# Patient Record
Sex: Female | Born: 1970 | Race: White | Hispanic: No | Marital: Single | Smoking: Former smoker
Health system: Southern US, Community
[De-identification: ages and names within clinical notes are randomized; demographics above are authoritative.]

## PROBLEM LIST (undated history)

## (undated) DIAGNOSIS — F319 Bipolar disorder, unspecified: Secondary | ICD-10-CM

## (undated) DIAGNOSIS — M549 Dorsalgia, unspecified: Secondary | ICD-10-CM

## (undated) HISTORY — PX: KNEE SURGERY: SHX244

## (undated) HISTORY — PX: ABDOMINAL HYSTERECTOMY: SHX81

---

## 2007-03-14 ENCOUNTER — Emergency Department (HOSPITAL_COMMUNITY): Admission: EM | Admit: 2007-03-14 | Discharge: 2007-03-14 | Payer: Self-pay | Admitting: Emergency Medicine

## 2010-05-31 ENCOUNTER — Emergency Department (HOSPITAL_COMMUNITY): Admission: EM | Admit: 2010-05-31 | Discharge: 2010-05-31 | Payer: Self-pay | Admitting: Emergency Medicine

## 2010-06-22 ENCOUNTER — Emergency Department (HOSPITAL_COMMUNITY): Admission: EM | Admit: 2010-06-22 | Discharge: 2010-06-22 | Payer: Self-pay | Admitting: Emergency Medicine

## 2010-07-27 ENCOUNTER — Emergency Department (HOSPITAL_COMMUNITY): Admission: EM | Admit: 2010-07-27 | Discharge: 2010-07-27 | Payer: Self-pay | Admitting: Family Medicine

## 2010-08-15 ENCOUNTER — Encounter: Admission: RE | Admit: 2010-08-15 | Discharge: 2010-08-15 | Payer: Self-pay | Admitting: Family Medicine

## 2010-08-23 ENCOUNTER — Encounter: Admission: RE | Admit: 2010-08-23 | Discharge: 2010-09-21 | Payer: Self-pay | Admitting: Sports Medicine

## 2010-08-24 ENCOUNTER — Encounter: Admission: RE | Admit: 2010-08-24 | Discharge: 2010-08-24 | Payer: Self-pay | Admitting: Sports Medicine

## 2010-12-05 ENCOUNTER — Emergency Department (HOSPITAL_COMMUNITY): Admission: EM | Admit: 2010-12-05 | Payer: Self-pay | Source: Home / Self Care | Admitting: Family Medicine

## 2011-02-10 LAB — POCT I-STAT, CHEM 8
BUN: 13 mg/dL (ref 6–23)
Chloride: 100 mEq/L (ref 96–112)
Creatinine, Ser: 1.2 mg/dL (ref 0.4–1.2)
Glucose, Bld: 101 mg/dL — ABNORMAL HIGH (ref 70–99)
Potassium: 4 mEq/L (ref 3.5–5.1)

## 2011-02-10 LAB — DIFFERENTIAL
Eosinophils Relative: 1 % (ref 0–5)
Lymphocytes Relative: 38 % (ref 12–46)
Monocytes Absolute: 0.6 10*3/uL (ref 0.1–1.0)
Monocytes Relative: 6 % (ref 3–12)
Neutro Abs: 5.6 10*3/uL (ref 1.7–7.7)

## 2011-02-10 LAB — URINALYSIS, ROUTINE W REFLEX MICROSCOPIC
Bilirubin Urine: NEGATIVE
Hgb urine dipstick: NEGATIVE
Specific Gravity, Urine: 1.009 (ref 1.005–1.030)
pH: 5.5 (ref 5.0–8.0)

## 2011-02-10 LAB — CBC
HCT: 34 % — ABNORMAL LOW (ref 36.0–46.0)
Hemoglobin: 11.9 g/dL — ABNORMAL LOW (ref 12.0–15.0)
MCHC: 35.1 g/dL (ref 30.0–36.0)
MCV: 91.4 fL (ref 78.0–100.0)

## 2011-02-11 LAB — BASIC METABOLIC PANEL
CO2: 33 mEq/L — ABNORMAL HIGH (ref 19–32)
Chloride: 101 mEq/L (ref 96–112)
GFR calc non Af Amer: 59 mL/min — ABNORMAL LOW (ref >60)
Glucose, Bld: 87 mg/dL (ref 70–99)
Potassium: 3.6 mEq/L (ref 3.5–5.1)
Sodium: 138 mEq/L (ref 135–145)

## 2011-02-11 LAB — CBC
HCT: 37.2 % (ref 36.0–46.0)
Hemoglobin: 13 g/dL (ref 12.0–15.0)
MCH: 31.6 pg (ref 26.0–34.0)
MCHC: 35 g/dL (ref 30.0–36.0)

## 2011-02-11 LAB — DIFFERENTIAL
Basophils Relative: 1 % (ref 0–1)
Monocytes Absolute: 0.8 10*3/uL (ref 0.1–1.0)
Monocytes Relative: 6 % (ref 3–12)
Neutro Abs: 8.6 10*3/uL — ABNORMAL HIGH (ref 1.7–7.7)

## 2011-07-18 ENCOUNTER — Emergency Department (HOSPITAL_COMMUNITY)
Admission: EM | Admit: 2011-07-18 | Discharge: 2011-07-18 | Disposition: A | Discharge status: Home / Self Care | Payer: Medicaid - Out of State | Attending: Emergency Medicine | Admitting: Emergency Medicine

## 2011-07-18 DIAGNOSIS — M1710 Unilateral primary osteoarthritis, unspecified knee: Secondary | ICD-10-CM | POA: Insufficient documentation

## 2011-07-18 DIAGNOSIS — M549 Dorsalgia, unspecified: Secondary | ICD-10-CM | POA: Insufficient documentation

## 2011-07-18 DIAGNOSIS — I10 Essential (primary) hypertension: Secondary | ICD-10-CM | POA: Insufficient documentation

## 2011-07-18 DIAGNOSIS — R609 Edema, unspecified: Secondary | ICD-10-CM | POA: Insufficient documentation

## 2011-10-01 ENCOUNTER — Encounter: Payer: Self-pay | Admitting: *Deleted

## 2011-10-01 ENCOUNTER — Emergency Department (HOSPITAL_COMMUNITY)
Admission: EM | Admit: 2011-10-01 | Discharge: 2011-10-02 | Disposition: A | Discharge status: Home / Self Care | Payer: Medicaid - Out of State | Attending: Emergency Medicine | Admitting: Emergency Medicine

## 2011-10-01 DIAGNOSIS — R0789 Other chest pain: Secondary | ICD-10-CM | POA: Insufficient documentation

## 2011-10-01 DIAGNOSIS — R51 Headache: Secondary | ICD-10-CM | POA: Insufficient documentation

## 2011-10-01 DIAGNOSIS — R0602 Shortness of breath: Secondary | ICD-10-CM | POA: Insufficient documentation

## 2011-10-01 DIAGNOSIS — J45901 Unspecified asthma with (acute) exacerbation: Secondary | ICD-10-CM | POA: Insufficient documentation

## 2011-10-01 DIAGNOSIS — R05 Cough: Secondary | ICD-10-CM | POA: Insufficient documentation

## 2011-10-01 DIAGNOSIS — F319 Bipolar disorder, unspecified: Secondary | ICD-10-CM | POA: Insufficient documentation

## 2011-10-01 DIAGNOSIS — R059 Cough, unspecified: Secondary | ICD-10-CM | POA: Insufficient documentation

## 2011-10-01 HISTORY — DX: Bipolar disorder, unspecified: F31.9

## 2011-10-01 MED ORDER — ALBUTEROL SULFATE (5 MG/ML) 0.5% IN NEBU
5.0000 mg | INHALATION_SOLUTION | Freq: Once | RESPIRATORY_TRACT | Status: AC
  Administered 2011-10-02: 5 mg via RESPIRATORY_TRACT
  Filled 2011-10-01 (×2): qty 0.5

## 2011-10-01 NOTE — ED Notes (Signed)
Pt in c/o cough x2 days and asthma attack tonight, pt states she is out of home inhailer

## 2011-10-02 MED ORDER — ALBUTEROL SULFATE HFA 108 (90 BASE) MCG/ACT IN AERS: 2.0000 | INHALATION_SPRAY | RESPIRATORY_TRACT | Status: DC | PRN

## 2011-10-02 MED ORDER — ALBUTEROL SULFATE HFA 108 (90 BASE) MCG/ACT IN AERS
2.0000 | INHALATION_SPRAY | Freq: Once | RESPIRATORY_TRACT | Status: AC
  Administered 2011-10-02: 2 via RESPIRATORY_TRACT
  Filled 2011-10-02: qty 6.7

## 2011-10-02 MED ORDER — PREDNISONE 10 MG PO TABS: 20.0000 mg | ORAL_TABLET | Freq: Every day | ORAL | Status: DC

## 2011-10-02 MED ORDER — PREDNISONE 50 MG PO TABS
50.0000 mg | ORAL_TABLET | Freq: Once | ORAL | Status: AC
  Administered 2011-10-02: 50 mg via ORAL
  Filled 2011-10-02: qty 1

## 2011-10-02 NOTE — ED Provider Notes (Signed)
History     CSN: [id number] Arrival date & time: 10/01/2011 11:51 PM   None     Chief Complaint  Patient presents with  . Asthma    (Consider location/radiation/quality/duration/timing/severity/associated sxs/prior treatment) HPI Comments: Patient here with worsening shortness of breath and cough - she states that she just moved here from [place], states that she usually has an inhaler but she is out and cannot get another.  Reports wheezing, no fever, reports runny nose and congestion.  Patient is a 40 y.o. female presenting with asthma. The history is provided by the patient. No language interpreter was used.  Asthma This is a new problem. The current episode started yesterday. The problem occurs constantly. Associated symptoms include congestion, coughing, headaches and myalgias. Pertinent negatives include no abdominal pain, arthralgias, change in bowel habit, chills, diaphoresis, fatigue, fever, nausea, sore throat, swollen glands, visual change, vomiting or weakness. The symptoms are aggravated by coughing. She has tried nothing for the symptoms. The treatment provided no relief.    Past Medical History  Diagnosis Date  . Asthma   . Bipolar 1 disorder     History reviewed. No pertinent past surgical history.  History reviewed. No pertinent family history.  History  Substance Use Topics  . Smoking status: Former Games developer  . Smokeless tobacco: Not on file  . Alcohol Use: No    OB History    Grav Para Term Preterm Abortions TAB SAB Ect Mult Living                  Review of Systems  Constitutional: Negative.  Negative for fever, chills, diaphoresis and fatigue.  HENT: Positive for congestion, rhinorrhea and postnasal drip. Negative for nosebleeds and sore throat.   Eyes: Negative.   Respiratory: Positive for cough, chest tightness, shortness of breath and wheezing.   Cardiovascular: Negative.   Gastrointestinal: Negative.  Negative for nausea, vomiting, abdominal  pain and change in bowel habit.  Genitourinary: Negative.   Musculoskeletal: Positive for myalgias. Negative for arthralgias.  Skin: Negative.   Neurological: Positive for headaches. Negative for weakness.  Hematological: Negative.   Psychiatric/Behavioral: Negative.     Allergies  Review of patient's allergies indicates no known allergies.  Home Medications   Current Outpatient Rx  Name Route Sig Dispense Refill  . FLUOXETINE HCL 40 MG PO CAPS Oral Take 40 mg by mouth daily.      Marland Kitchen TIZANIDINE HCL PO Oral Take by mouth.        BP 137/88  Pulse 80  Temp(Src) 97.9 F (36.6 C) (Oral)  Resp 22  SpO2 97%  Physical Exam  Constitutional: She is oriented to person, place, and time. She appears well-developed and well-nourished.  HENT:  Head: Normocephalic and atraumatic.  Eyes: Conjunctivae are normal. Pupils are equal, round, and reactive to light.  Neck: Normal range of motion. Neck supple.  Cardiovascular: Normal rate, regular rhythm and normal heart sounds.   Pulmonary/Chest: Effort normal. Not tachypneic. No respiratory distress. She has wheezes in the right upper field and the left upper field. She has no rhonchi. She has no rales. She exhibits no tenderness.  Abdominal: Soft. There is no tenderness.  Musculoskeletal: Normal range of motion.  Neurological: She is alert and oriented to person, place, and time.  Skin: Skin is warm and dry.  Psychiatric: She has a normal mood and affect. Her behavior is normal. Judgment and thought content normal.    ED Course  Procedures (including critical care time)  Labs  Reviewed - No data to display No results found.   Asthma Attack   MDM  Patient with known history of asthma, currently out of her inhaler - will give inhaler here and start on prednisone.        Izola Price [place], [place] 10/02/11 0033  Izola Price [place], [place] 10/02/11 0039  Izola Price [place], [place] 10/05/11 [phone]

## 2011-10-02 NOTE — ED Notes (Signed)
Pt verbalized understanding of Rx medications and plan of care at home.

## 2011-10-06 NOTE — ED Provider Notes (Signed)
Medical screening examination/treatment/procedure(s) were conducted as a shared visit with non-physician practitioner(s) and myself.  I personally evaluated the patient during the encounter  Toy Baker, MD 10/06/11 1056

## 2011-10-09 ENCOUNTER — Encounter (HOSPITAL_COMMUNITY): Payer: Self-pay | Admitting: *Deleted

## 2011-10-09 ENCOUNTER — Emergency Department (HOSPITAL_COMMUNITY)
Admission: EM | Admit: 2011-10-09 | Discharge: 2011-10-09 | Disposition: A | Discharge status: Home / Self Care | Payer: Medicaid - Out of State | Attending: Emergency Medicine | Admitting: Emergency Medicine

## 2011-10-09 ENCOUNTER — Emergency Department (HOSPITAL_COMMUNITY): Payer: Medicaid - Out of State

## 2011-10-09 DIAGNOSIS — R319 Hematuria, unspecified: Secondary | ICD-10-CM | POA: Insufficient documentation

## 2011-10-09 DIAGNOSIS — N390 Urinary tract infection, site not specified: Secondary | ICD-10-CM | POA: Insufficient documentation

## 2011-10-09 DIAGNOSIS — R300 Dysuria: Secondary | ICD-10-CM | POA: Insufficient documentation

## 2011-10-09 DIAGNOSIS — R1032 Left lower quadrant pain: Secondary | ICD-10-CM | POA: Insufficient documentation

## 2011-10-09 LAB — URINE MICROSCOPIC-ADD ON

## 2011-10-09 LAB — URINALYSIS, ROUTINE W REFLEX MICROSCOPIC
Bilirubin Urine: NEGATIVE
Specific Gravity, Urine: 1.024 (ref 1.005–1.030)
Urobilinogen, UA: 0.2 mg/dL (ref 0.0–1.0)

## 2011-10-09 MED ORDER — NITROFURANTOIN MONOHYD MACRO 100 MG PO CAPS: 100.0000 mg | ORAL_CAPSULE | Freq: Two times a day (BID) | ORAL | Status: AC

## 2011-10-09 MED ORDER — ALBUTEROL SULFATE HFA 108 (90 BASE) MCG/ACT IN AERS
2.0000 | INHALATION_SPRAY | Freq: Once | RESPIRATORY_TRACT | Status: AC
  Administered 2011-10-09: 2 via RESPIRATORY_TRACT
  Filled 2011-10-09: qty 6.7

## 2011-10-09 NOTE — ED Notes (Signed)
Pt states "have had UTI's before but I have never had the bleeding"

## 2011-10-09 NOTE — ED Notes (Signed)
MD at bedside. 

## 2011-10-09 NOTE — ED Notes (Signed)
Patient is resting comfortably. 

## 2011-10-09 NOTE — ED Notes (Signed)
Family at bedside. 

## 2011-10-09 NOTE — ED Provider Notes (Signed)
History     CSN: [id number] Arrival date & time: 10/09/2011 10:49 AM   First MD Initiated Contact with Patient 10/09/11 1127      Chief Complaint  Patient presents with  . Urinary Tract Infection    (Consider location/radiation/quality/duration/timing/severity/associated sxs/prior treatment) Patient is a 40 y.o. female presenting with urinary tract infection.  Urinary Tract Infection This is a new problem. The current episode started in the past 7 days. The problem occurs 2 to 4 times per day. The problem has been gradually worsening. Associated symptoms include abdominal pain, nausea and urinary symptoms. Pertinent negatives include no anorexia, arthralgias, change in bowel habit, chest pain, chills, congestion, coughing, diaphoresis, fatigue, fever, headaches, joint swelling, myalgias, neck pain, numbness, rash, sore throat, swollen glands, vertigo, visual change, vomiting or weakness. Exacerbated by: micturition. She has tried nothing for the symptoms.  Pt has had pain with urination for 2 days that progressed into some pink tinged urine. She denies any kind of vaginal bleeding or discharge. She states she saw a clot in the urine but the blood is only associated with urination and no blood or other discharge on her underwear. No new sexual partners recently. No trauma to the area.   Past Medical History  Diagnosis Date  . Asthma   . Bipolar 1 disorder     Past Surgical History  Procedure Date  . Cesarean section   . Abdominal hysterectomy   . Knee surgery     left    No family history on file.  History  Substance Use Topics  . Smoking status: Former Games developer  . Smokeless tobacco: Not on file  . Alcohol Use: No    OB History    Grav Para Term Preterm Abortions TAB SAB Ect Mult Living                  Review of Systems  Constitutional: Negative for fever, chills, diaphoresis, activity change, appetite change and fatigue.  HENT: Negative for congestion, sore throat  and neck pain.   Respiratory: Negative for cough.   Cardiovascular: Negative for chest pain.  Gastrointestinal: Positive for nausea and abdominal pain. Negative for vomiting, anorexia and change in bowel habit.       Pt is having some L flank pain that radiates down into the L groin  Genitourinary: Positive for dysuria, frequency, hematuria, flank pain and enuresis. Negative for urgency, decreased urine volume, vaginal bleeding, vaginal discharge, difficulty urinating, genital sores, vaginal pain, menstrual problem, pelvic pain and dyspareunia.  Musculoskeletal: Negative for myalgias, joint swelling and arthralgias.  Skin: Negative for rash.  Neurological: Negative for vertigo, weakness, numbness and headaches.  All other systems reviewed and are negative.    Allergies  Bee venom  Home Medications   Current Outpatient Rx  Name Route Sig Dispense Refill  . ALBUTEROL SULFATE HFA 108 (90 BASE) MCG/ACT IN AERS Inhalation Inhale 2 puffs into the lungs every 4 (four) hours as needed for wheezing. 1 Inhaler 0  . FLUOXETINE HCL 40 MG PO CAPS Oral Take 40 mg by mouth daily.     Marland Kitchen PREDNISONE 10 MG PO TABS Oral Take 20 mg by mouth daily.      Marland Kitchen PSEUDOEPHEDRINE-IBUPROFEN 30-200 MG PO CAPS Oral Take 2 capsules by mouth every 6 (six) hours as needed. For congestion/ allergies     . TIZANIDINE HCL 2 MG PO TABS Oral Take 3 mg by mouth 2 (two) times daily.      . TRAZODONE HCL 100  MG PO TABS Oral Take 100 mg by mouth at bedtime.        BP 134/84  Pulse 66  Temp(Src) 98.1 F (36.7 C) (Oral)  Resp 18  Wt 250 lb (113.399 kg)  SpO2 99%  Physical Exam  Vitals reviewed. Constitutional: She is oriented to person, place, and time. She appears well-developed and well-nourished.       Obese  HENT:  Head: Normocephalic and atraumatic.  Eyes: EOM are normal. Pupils are equal, round, and reactive to light.  Neck: Normal range of motion. Neck supple.  Cardiovascular: Normal rate, regular rhythm and  normal heart sounds.   Pulmonary/Chest: Breath sounds normal. No respiratory distress. She has no wheezes.  Abdominal: Soft. Bowel sounds are normal. She exhibits no distension. There is tenderness. There is no rebound and no guarding.       Pain in the L groin that radiates from the L flank.  Musculoskeletal: Normal range of motion.  Neurological: She is alert and oriented to person, place, and time. No cranial nerve deficit.  Skin: Skin is warm and dry.    ED Course  Procedures (including critical care time)  Labs Reviewed  URINALYSIS, ROUTINE W REFLEX MICROSCOPIC - Abnormal; Notable for the following:    Appearance CLOUDY (*)    Hgb urine dipstick LARGE (*)    Protein, ur 30 (*)    Nitrite POSITIVE (*)    Leukocytes, UA LARGE (*)    All other components within normal limits  URINE MICROSCOPIC-ADD ON - Abnormal; Notable for the following:    Bacteria, UA MANY (*)    All other components within normal limits   Ct Abdomen Pelvis Wo Contrast  10/09/2011  *RADIOLOGY REPORT*  Clinical Data: Left lower quadrant abdominal pain, hematuria, painful urination  CT ABDOMEN AND PELVIS WITHOUT CONTRAST  Technique:  Multidetector CT imaging of the abdomen and pelvis was performed following the standard protocol without intravenous contrast.  Comparison: None.  Findings: The lung bases are clear.  The liver is unremarkable in the unenhanced state.  The gallbladder is contracted and no calcified gallstones are seen.  The pancreas is normal in size and the pancreatic duct is not dilated.  The adrenal glands and spleen are unremarkable.  The stomach is not well distended.  The kidneys are unremarkable in the unenhanced state with no calculi or hydronephrosis noted.  Abdominal aorta is normal in caliber.  No adenopathy is seen.  The urinary bladder is somewhat thick-walled.  Although this could be normal with lack of distention, cystitis is a consideration. The uterus has been resected.  No fluid is seen  within the pelvis. The terminal ileum is unremarkable as is the appendix.  No bony abnormality is seen  IMPRESSION:  1.  Thick-walled urinary bladder.  The bladder is not distended and this could be normal, but cystitis is a consideration. 2.  No hydronephrosis.  No renal calculi.  Original Report Authenticated By: Juline Patch, M.D.     1. UTI (lower urinary tract infection)       MDM  Per pt, she is having some blood in her urine and pain with urination. She has had UTIs in the past. We will check CT abd/pelvis to evaluate for kidney stone and check U/A for signs of infection.  1:15 PM Talked with pt about the results of her U/A and CT and gave her albuterol inhaler in the ED as she is unable to afford one. She was given Rx for  her UTI and will be discharged home. Talked to her about coming back if her symptoms are not resolved in 7 days.         Genella Mech, MD 10/09/11 1316

## 2011-10-11 NOTE — ED Provider Notes (Signed)
I saw and evaluated the patient, reviewed the resident's note and I agree with the findings and plan.  Toy Baker, MD 10/11/11 [phone]

## 2011-11-16 ENCOUNTER — Encounter (HOSPITAL_COMMUNITY): Payer: Self-pay | Admitting: *Deleted

## 2011-11-16 ENCOUNTER — Emergency Department (HOSPITAL_COMMUNITY)
Admission: EM | Admit: 2011-11-16 | Discharge: 2011-11-16 | Disposition: A | Discharge status: Home / Self Care | Payer: Medicaid - Out of State | Attending: Emergency Medicine | Admitting: Emergency Medicine

## 2011-11-16 DIAGNOSIS — J45909 Unspecified asthma, uncomplicated: Secondary | ICD-10-CM | POA: Insufficient documentation

## 2011-11-16 DIAGNOSIS — Z79899 Other long term (current) drug therapy: Secondary | ICD-10-CM | POA: Insufficient documentation

## 2011-11-16 DIAGNOSIS — A499 Bacterial infection, unspecified: Secondary | ICD-10-CM | POA: Insufficient documentation

## 2011-11-16 DIAGNOSIS — L293 Anogenital pruritus, unspecified: Secondary | ICD-10-CM | POA: Insufficient documentation

## 2011-11-16 DIAGNOSIS — N760 Acute vaginitis: Secondary | ICD-10-CM | POA: Insufficient documentation

## 2011-11-16 DIAGNOSIS — R300 Dysuria: Secondary | ICD-10-CM | POA: Insufficient documentation

## 2011-11-16 DIAGNOSIS — B9689 Other specified bacterial agents as the cause of diseases classified elsewhere: Secondary | ICD-10-CM | POA: Insufficient documentation

## 2011-11-16 DIAGNOSIS — F17200 Nicotine dependence, unspecified, uncomplicated: Secondary | ICD-10-CM | POA: Insufficient documentation

## 2011-11-16 DIAGNOSIS — N390 Urinary tract infection, site not specified: Secondary | ICD-10-CM | POA: Insufficient documentation

## 2011-11-16 DIAGNOSIS — F3130 Bipolar disorder, current episode depressed, mild or moderate severity, unspecified: Secondary | ICD-10-CM | POA: Insufficient documentation

## 2011-11-16 LAB — URINE MICROSCOPIC-ADD ON

## 2011-11-16 LAB — URINALYSIS, ROUTINE W REFLEX MICROSCOPIC
Glucose, UA: NEGATIVE mg/dL
Nitrite: NEGATIVE
Protein, ur: NEGATIVE mg/dL
Urobilinogen, UA: 0.2 mg/dL (ref 0.0–1.0)

## 2011-11-16 LAB — WET PREP, GENITAL
Trich, Wet Prep: NONE SEEN
Yeast Wet Prep HPF POC: NONE SEEN

## 2011-11-16 LAB — POCT PREGNANCY, URINE: Preg Test, Ur: NEGATIVE

## 2011-11-16 MED ORDER — METRONIDAZOLE 0.75 % VA GEL: 1.0000 | Freq: Every day | VAGINAL | Status: AC

## 2011-11-16 NOTE — ED Notes (Signed)
Pt reports tx for UTI last month, given antibiotic and finished. Pt reports began to have symptoms (burning with urination) again this week and started to take Keflex (which she had at home) to tx for UTI. Now reports itching, vaginal discharge white in color that started this am. Believes she has yeast infection. Reports pain to lower abdomen with nausea.

## 2011-11-16 NOTE — ED Notes (Signed)
Patient reports she developed uti in November.  She has problems since.  She started taking antibiotic she had at home 4 days ago.  She now states she has pain when she voids and irritation in her vaginal area

## 2011-11-16 NOTE — ED Provider Notes (Signed)
History     CSN: [id number]  Arrival date & time 11/16/11  1125   Chief Complaint  Patient presents with  . Urinary Tract Infection   HPI Pt was seen at 1150.  Per pt, c/o gradual onset and persistence of constant dysuria and vaginal discharge x4 days.  Has been assoc with "itching."  Describes the discharge as "white" and "thick."  States she started taking "an old rx of keflex that I never took for a URI" 4 days ago (10 day course).  Denies abd pain, no N/V/D, no flank pain, no rash, no fevers.     Past Medical History  Diagnosis Date  . Asthma   . Bipolar 1 disorder     Past Surgical History  Procedure Date  . Cesarean section   . Abdominal hysterectomy   . Knee surgery     left    History  Substance Use Topics  . Smoking status: Current Everyday Smoker  . Smokeless tobacco: Not on file  . Alcohol Use: Yes   Review of Systems ROS: Statement: All systems negative except as marked or noted in the HPI; Constitutional: Negative for fever and chills. ; ; Eyes: Negative for eye pain, redness and discharge. ; ; ENMT: Negative for ear pain, hoarseness, nasal congestion, sinus pressure and sore throat. ; ; Cardiovascular: Negative for chest pain, palpitations, diaphoresis, dyspnea and peripheral edema. ; ; Respiratory: Negative for cough, wheezing and stridor. ; ; Gastrointestinal: Negative for nausea, vomiting, diarrhea, abdominal pain, blood in stool, hematemesis, jaundice and rectal bleeding. . ; ; Genitourinary: +dysuria, Negative for flank pain and hematuria. ; GYN:  No vaginal bleeding, +vaginal discharge and itching, no vulvar pain.; Musculoskeletal: Negative for back pain and neck pain. Negative for swelling and trauma.; ; Skin: Negative for pruritus, rash, abrasions, blisters, bruising and skin lesion.; ; Neuro: Negative for headache, lightheadedness and neck stiffness. Negative for weakness, altered level of consciousness , altered mental status, extremity weakness,  paresthesias, involuntary movement, seizure and syncope.     Allergies  Bee venom  Home Medications   Current Outpatient Rx  Name Route Sig Dispense Refill  . ALBUTEROL SULFATE HFA 108 (90 BASE) MCG/ACT IN AERS Inhalation Inhale 2 puffs into the lungs every 6 (six) hours as needed. For shortness of breath     . FLUOXETINE HCL 40 MG PO CAPS Oral Take 40 mg by mouth daily.     Marland Kitchen PSEUDOEPHEDRINE-IBUPROFEN 30-200 MG PO CAPS Oral Take 2 capsules by mouth every 6 (six) hours as needed. For congestion/ allergies     . TIZANIDINE HCL 2 MG PO TABS Oral Take 3 mg by mouth 2 (two) times daily.      . TRAZODONE HCL 100 MG PO TABS Oral Take 100 mg by mouth at bedtime.        BP 128/79  Pulse 61  Temp(Src) 97.7 F (36.5 C) (Oral)  Resp 16  SpO2 96%  Physical Exam 1155:  Physical examination:  Nursing notes reviewed; Vital signs and O2 SAT reviewed;  Constitutional: Well developed, Well nourished, Well hydrated, In no acute distress; Head:  Normocephalic, atraumatic; Eyes: EOMI, PERRL, No scleral icterus; ENMT: Mouth and pharynx normal, Mucous membranes moist; Neck: Supple, Full range of motion, No lymphadenopathy; Cardiovascular: Regular rate and rhythm, No murmur, rub, or gallop; Respiratory: Breath sounds clear & equal bilaterally, No rales, rhonchi, wheezes, or rub, Normal respiratory effort/excursion; Chest: Nontender, Movement normal; Abdomen: Soft, Nontender, Nondistended, Normal bowel sounds; Genitourinary: No CVA tenderness; Pelvic  exam performed with permission of pt and female ED tech assist during exam.  External genitalia w/o lesions. Vaginal vault with thick white discharge.  Cervix w/o lesions, not friable, GC/chlam and wet prep obtained and sent to lab.  Bimanual exam w/o CMT, uterine or adenexal tenderness. Extremities: Pulses normal, No tenderness, No edema, No calf edema or asymmetry.; Neuro: AA&Ox3, Major CN grossly intact.  No gross focal motor or sensory deficits in extremities.;  Skin: Color normal, Warm, Dry, no rash.    ED Course  Procedures    MDM  MDM Reviewed: nursing note and vitals Interpretation: labs     Results for orders placed during the hospital encounter of 11/16/11  URINALYSIS, ROUTINE W REFLEX MICROSCOPIC      Component Value Range   Color, Urine YELLOW  YELLOW    APPearance CLEAR  CLEAR    Specific Gravity, Urine 1.002 (*) 1.005 - 1.030    pH 6.0  5.0 - 8.0    Glucose, UA NEGATIVE  NEGATIVE (mg/dL)   Hgb urine dipstick NEGATIVE  NEGATIVE    Bilirubin Urine NEGATIVE  NEGATIVE    Ketones, ur NEGATIVE  NEGATIVE (mg/dL)   Protein, ur NEGATIVE  NEGATIVE (mg/dL)   Urobilinogen, UA 0.2  0.0 - 1.0 (mg/dL)   Nitrite NEGATIVE  NEGATIVE    Leukocytes, UA MODERATE (*) NEGATIVE   POCT PREGNANCY, URINE      Component Value Range   Preg Test, Ur NEGATIVE    WET PREP, GENITAL      Component Value Range   Yeast, Wet Prep NONE SEEN  NONE SEEN    Trich, Wet Prep NONE SEEN  NONE SEEN    Clue Cells, Wet Prep FEW (*) NONE SEEN    WBC, Wet Prep HPF POC FEW (*) NONE SEEN   URINE MICROSCOPIC-ADD ON      Component Value Range   Squamous Epithelial / LPF FEW (*) RARE    WBC, UA 11-20  <3 (WBC/hpf)   Bacteria, UA FEW (*) RARE       1:13 PM:  Pt already taking keflex, will have her continue, UC pending.  Will tx for BV.  Dx testing d/w pt.  Questions answered.  Verb understanding, encouraged to obtain OB for outpt f/u.  Agreeable.      Shakeria Robinette Allison Quarry, DO 11/19/11 [phone]

## 2011-11-18 LAB — URINE CULTURE: Culture  Setup Time: 201212211212

## 2011-12-22 ENCOUNTER — Other Ambulatory Visit: Payer: Self-pay

## 2011-12-22 ENCOUNTER — Encounter (HOSPITAL_COMMUNITY): Payer: Self-pay | Admitting: Adult Health

## 2011-12-22 ENCOUNTER — Emergency Department (HOSPITAL_COMMUNITY)
Admission: EM | Admit: 2011-12-22 | Discharge: 2011-12-22 | Disposition: A | Discharge status: Home / Self Care | Payer: Medicaid - Out of State | Attending: Emergency Medicine | Admitting: Emergency Medicine

## 2011-12-22 ENCOUNTER — Emergency Department (HOSPITAL_COMMUNITY): Payer: Medicaid - Out of State

## 2011-12-22 DIAGNOSIS — R42 Dizziness and giddiness: Secondary | ICD-10-CM | POA: Insufficient documentation

## 2011-12-22 DIAGNOSIS — R5381 Other malaise: Secondary | ICD-10-CM | POA: Insufficient documentation

## 2011-12-22 DIAGNOSIS — R001 Bradycardia, unspecified: Secondary | ICD-10-CM

## 2011-12-22 DIAGNOSIS — K029 Dental caries, unspecified: Secondary | ICD-10-CM | POA: Insufficient documentation

## 2011-12-22 DIAGNOSIS — R5383 Other fatigue: Secondary | ICD-10-CM | POA: Insufficient documentation

## 2011-12-22 DIAGNOSIS — I498 Other specified cardiac arrhythmias: Secondary | ICD-10-CM | POA: Insufficient documentation

## 2011-12-22 DIAGNOSIS — K089 Disorder of teeth and supporting structures, unspecified: Secondary | ICD-10-CM | POA: Insufficient documentation

## 2011-12-22 LAB — POCT I-STAT, CHEM 8
BUN: 8 mg/dL (ref 6–23)
Chloride: 105 mEq/L (ref 96–112)
Glucose, Bld: 84 mg/dL (ref 70–99)
HCT: 42 % (ref 36.0–46.0)
Potassium: 4.1 mEq/L (ref 3.5–5.1)

## 2011-12-22 LAB — POCT I-STAT TROPONIN I: Troponin i, poc: 0.01 ng/mL (ref 0.00–0.08)

## 2011-12-22 MED ORDER — FLUCONAZOLE 200 MG PO TABS: 200.0000 mg | ORAL_TABLET | Freq: Every day | ORAL | Status: AC

## 2011-12-22 MED ORDER — BUPIVACAINE HCL (PF) 0.5 % IJ SOLN
10.0000 mL | Freq: Once | INTRAMUSCULAR | Status: AC
  Administered 2011-12-22: 10 mL
  Filled 2011-12-22: qty 30

## 2011-12-22 MED ORDER — PENICILLIN V POTASSIUM 500 MG PO TABS: 500.0000 mg | ORAL_TABLET | Freq: Three times a day (TID) | ORAL | Status: AC

## 2011-12-22 MED ORDER — HYDROCODONE-ACETAMINOPHEN 5-325 MG PO TABS: 1.0000 | ORAL_TABLET | Freq: Four times a day (QID) | ORAL | Status: AC | PRN

## 2011-12-22 NOTE — ED Provider Notes (Signed)
History     CSN: [id number]  Arrival date & time 12/22/11  1530   First MD Initiated Contact with Patient 12/22/11 1723      6:17 PM HPI Reports Left side toothache that began 5days ago. States pain radiates to entire left side. Denies fever, difficulty swallowing or breathing. Reports 1 year ago the tooth broke and it has not bothered her since. Also reports feeling tired and weak while she has been here. Reports in Triage her HR was 44. Denies CP, SOB, n/v, dysuria, hematuria, abdominal pain, headache. Patient is a 41 y.o. female presenting with tooth pain. The history is provided by the patient.  Dental PainThe primary symptoms include dental injury and headaches. Primary symptoms do not include fever, shortness of breath, sore throat, angioedema or cough. The symptoms began 3 to 5 days ago. The symptoms are worsening. The symptoms are new. The symptoms occur constantly.  The headache is associated with weakness.   Additional symptoms include: dental sensitivity to temperature, gum swelling, gum tenderness, jaw pain, ear pain and fatigue. Additional symptoms do not include: purulent gums, facial swelling and trouble swallowing. Medical issues include: smoking and periodontal disease.    Past Medical History  Diagnosis Date  . Asthma   . Bipolar 1 disorder     Past Surgical History  Procedure Date  . Cesarean section   . Abdominal hysterectomy   . Knee surgery     left    History reviewed. No pertinent family history.  History  Substance Use Topics  . Smoking status: Current Everyday Smoker  . Smokeless tobacco: Not on file  . Alcohol Use: Yes    OB History    Grav Para Term Preterm Abortions TAB SAB Ect Mult Living                  Review of Systems  Constitutional: Positive for fatigue. Negative for fever and chills.  HENT: Positive for ear pain and dental problem. Negative for sore throat, facial swelling, trouble swallowing and neck pain.   Respiratory:  Negative for cough and shortness of breath.   Cardiovascular: Negative for chest pain.  Gastrointestinal: Negative for nausea and vomiting.  Genitourinary: Negative for dysuria, urgency, frequency, hematuria, flank pain, vaginal discharge and vaginal pain.  Musculoskeletal: Negative for back pain.  Skin: Negative for wound.  Neurological: Positive for weakness, light-headedness and headaches. Negative for dizziness.  All other systems reviewed and are negative.    Allergies  Bee venom  Home Medications   Current Outpatient Rx  Name Route Sig Dispense Refill  . ALBUTEROL SULFATE HFA 108 (90 BASE) MCG/ACT IN AERS Inhalation Inhale 2 puffs into the lungs every 6 (six) hours as needed. For shortness of breath    . DIPHENHYDRAMINE-APAP (SLEEP) 25-500 MG PO TABS Oral Take 1 tablet by mouth at bedtime as needed. PAIN    . FLUOXETINE HCL 40 MG PO CAPS Oral Take 40 mg by mouth daily.     . IBUPROFEN 200 MG PO TABS Oral Take 800 mg by mouth every 6 (six) hours as needed. PAIN    . TRAZODONE HCL 100 MG PO TABS Oral Take 100 mg by mouth at bedtime.        BP 135/79  Pulse 44  Temp 98.5 F (36.9 C)  Resp 16  Wt 252 lb (114.306 kg)  SpO2 100%  Physical Exam  Vitals reviewed. Constitutional: She is oriented to person, place, and time. Vital signs are normal. She appears well-developed  and well-nourished.  HENT:  Head: Normocephalic and atraumatic. No trismus in the jaw.  Right Ear: Tympanic membrane, external ear and ear canal normal.  Left Ear: Tympanic membrane, external ear and ear canal normal.  Mouth/Throat: Uvula is midline, oropharynx is clear and moist and mucous membranes are normal. Dental caries present. No dental abscesses.    Eyes: Conjunctivae are normal. Pupils are equal, round, and reactive to light.  Neck: Normal range of motion. Neck supple.  Cardiovascular: Normal rate, regular rhythm and normal heart sounds.  Exam reveals no friction rub.   No murmur  heard. Pulmonary/Chest: Effort normal and breath sounds normal. She has no wheezes. She has no rhonchi. She has no rales. She exhibits no tenderness.  Musculoskeletal: Normal range of motion.  Neurological: She is alert and oriented to person, place, and time. Coordination normal.  Skin: Skin is warm and dry. No rash noted. No erythema. No pallor.    ED Course  Dental Performed by: Thomasene Lot Authorized by: Thomasene Lot Consent: Verbal consent obtained. Consent given by: patient Site marked: the operative site was not marked Imaging studies: imaging studies not available Patient identity confirmed: verbally with patient Time out: Immediately prior to procedure a "time out" was called to verify the correct patient, procedure, equipment, support staff and site/side marked as required. Comments: 2cc bupivicaine 0.5% in left infralveolar nerve, Used 27 g needle   ED ECG REPORT   Date: 12/22/2011  EKG Time: 6:43 PM  Rate: 44  Rhythm: sinus bradycardia  Axis: nml  Intervals:none  ST&T Change: nml  Narrative Interpretation: sinus bradycardia  Results for orders placed during the hospital encounter of 12/22/11  POCT I-STAT, CHEM 8      Component Value Range   Sodium 142  135 - 145 (mEq/L)   Potassium 4.1  3.5 - 5.1 (mEq/L)   Chloride 105  96 - 112 (mEq/L)   BUN 8  6 - 23 (mg/dL)   Creatinine, Ser 1.61  0.50 - 1.10 (mg/dL)   Glucose, Bld 84  70 - 99 (mg/dL)   Calcium, Ion 0.96  0.45 - 1.32 (mmol/L)   TCO2 28  0 - 100 (mmol/L)   Hemoglobin 14.3  12.0 - 15.0 (g/dL)   HCT 40.9  81.1 - 91.4 (%)  POCT I-STAT TROPONIN I      Component Value Range   Troponin i, poc 0.01  0.00 - 0.08 (ng/mL)   Comment 3            Dg Chest 2 View  12/22/2011  *RADIOLOGY REPORT*  Clinical Data: 41 year old female with bradycardia.  Chest pain.  CHEST - 2 VIEW  Comparison: CT abdomen 10/09/2011.  Findings: Cardiac size at the upper limits of normal. Other mediastinal contours are within normal  limits.  Visualized tracheal air column is within normal limits.  The lungs are clear.  No pneumothorax or effusion. No acute osseous abnormality identified.  IMPRESSION: Cardiac size at the upper limits of normal. No acute cardiopulmonary abnormality.  Original Report Authenticated By: Harley Hallmark, M.D.     MDM   HR  61 and 54 While I was in room with the patient. Prior 3 visits patient had a HR of 60 and 61. Advised patient pain controlled and antibiotics for dental pain. Feels patient has a benign bradycardia but will discuss with Dr. Alto Denver.    7:48 PM Will Treat for Dental pain. Advised return for worsening signs of weakness, fatigue and near syncope.  Thomasene Lot, PA-C 12/22/11 1948

## 2011-12-22 NOTE — ED Notes (Addendum)
Reports toothache on left lower jaw that has increased in pain and intensity. Dental caries noted. Also complains of feeling tired and weak.  HR 38-50

## 2011-12-22 NOTE — ED Notes (Addendum)
MD at bedside. Bupivacaine given to PA.

## 2011-12-22 NOTE — ED Notes (Signed)
PA at bedside.

## 2011-12-22 NOTE — ED Notes (Signed)
Patient transported to X-ray, noted ambulating for CXR.

## 2011-12-23 NOTE — ED Provider Notes (Signed)
Medical screening examination/treatment/procedure(s) were conducted as a shared visit with non-physician practitioner(s) and myself.  I personally evaluated the patient during the encounter  Cyndra Numbers, MD 12/23/11 1037

## 2012-04-18 ENCOUNTER — Encounter (HOSPITAL_COMMUNITY): Payer: Self-pay | Admitting: *Deleted

## 2012-04-18 ENCOUNTER — Emergency Department (HOSPITAL_COMMUNITY)
Admission: EM | Admit: 2012-04-18 | Discharge: 2012-04-19 | Disposition: A | Discharge status: Home / Self Care | Payer: No Typology Code available for payment source | Attending: Emergency Medicine | Admitting: Emergency Medicine

## 2012-04-18 DIAGNOSIS — S335XXA Sprain of ligaments of lumbar spine, initial encounter: Secondary | ICD-10-CM | POA: Insufficient documentation

## 2012-04-18 DIAGNOSIS — R0789 Other chest pain: Secondary | ICD-10-CM

## 2012-04-18 DIAGNOSIS — R071 Chest pain on breathing: Secondary | ICD-10-CM | POA: Insufficient documentation

## 2012-04-18 DIAGNOSIS — R079 Chest pain, unspecified: Secondary | ICD-10-CM | POA: Insufficient documentation

## 2012-04-18 DIAGNOSIS — S39012A Strain of muscle, fascia and tendon of lower back, initial encounter: Secondary | ICD-10-CM
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
## 2012-04-19 ENCOUNTER — Emergency Department (HOSPITAL_COMMUNITY): Payer: No Typology Code available for payment source

## 2012-04-19 MED ORDER — HYDROCODONE-ACETAMINOPHEN 5-325 MG PO TABS
1.0000 | ORAL_TABLET | Freq: Once | ORAL | Status: AC
  Administered 2012-04-19: 1 via ORAL
  Filled 2012-04-19: qty 1

## 2012-04-19 MED ORDER — HYDROCODONE-ACETAMINOPHEN 5-325 MG PO TABS: 1.0000 | ORAL_TABLET | ORAL | Status: AC | PRN

## 2012-04-19 MED ORDER — CYCLOBENZAPRINE HCL 10 MG PO TABS: 10.0000 mg | ORAL_TABLET | Freq: Two times a day (BID) | ORAL | Status: AC | PRN
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
## 2012-04-19 NOTE — ED Provider Notes (Signed)
Medical screening examination/treatment/procedure(s) were performed by non-physician practitioner and as supervising physician I was immediately available for consultation/collaboration.   Loren Racer, MD 04/19/12 [phone]
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
## 2012-04-19 NOTE — Discharge Instructions (Signed)
Chest Wall Pain Chest wall pain is pain in or around the bones and muscles of your chest. It may take up to 6 weeks to get better. It may take longer if you must stay physically active in your work and activities.  CAUSES  Chest wall pain may happen on its own. However, it may be caused by:  A viral illness like the flu.   Injury.   Coughing.   Exercise.   Arthritis.   Fibromyalgia.   Shingles.  HOME CARE INSTRUCTIONS   Avoid overtiring physical activity. Try not to strain or perform activities that cause pain. This includes any activities using your chest or your abdominal and side muscles, especially if heavy weights are used.   Put ice on the sore area.   Put ice in a plastic bag.   Place a towel between your skin and the bag.   Leave the ice on for 15 to 20 minutes per hour while awake for the first 2 days.   Only take over-the-counter or prescription medicines for pain, discomfort, or fever as directed by your caregiver.  SEEK IMMEDIATE MEDICAL CARE IF:   Your pain increases, or you are very uncomfortable.   You have a fever.   Your chest pain becomes worse.   You have new, unexplained symptoms.   You have nausea or vomiting.   You feel sweaty or lightheaded.   You have a cough with phlegm (sputum), or you cough up blood.  MAKE SURE YOU:   Understand these instructions.   Will watch your condition.   Will get help right away if you are not doing well or get worse.  Document Released: 11/12/2005 Document Revised: 11/01/2011 Document Reviewed: 07/09/2011 [facility] Patient Information 2012 [place], [place].Low Back Strain with Rehab A strain is an injury in which a tendon or muscle is torn. The muscles and tendons of the lower back are vulnerable to strains. However, these muscles and tendons are very strong and require a great force to be injured. Strains are classified into three categories. Grade 1 strains cause pain, but the tendon is not lengthened. Grade  2 strains include a lengthened ligament, due to the ligament being stretched or partially ruptured. With grade 2 strains there is still function, although the function may be decreased. Grade 3 strains involve a complete tear of the tendon or muscle, and function is usually impaired. SYMPTOMS   Pain in the lower back.   Pain that affects one side more than the other.   Pain that gets worse with movement and may be felt in the hip, buttocks, or back of the thigh.   Muscle spasms of the muscles in the back.   Swelling along the muscles of the back.   Loss of strength of the back muscles.   Crackling sound (crepitation) when the muscles are touched.  CAUSES  Lower back strains occur when a force is placed on the muscles or tendons that is greater than they can handle. Common causes of injury include:  Prolonged overuse of the muscle-tendon units in the lower back, usually from incorrect posture.   A single violent injury or force applied to the back.  RISK INCREASES WITH:  Sports that involve twisting forces on the spine or a lot of bending at the waist (football, rugby, weightlifting, bowling, golf, tennis, speed skating, racquetball, swimming, running, gymnastics, diving).   Poor strength and flexibility.   Failure to warm up properly before activity.   Family history of lower back  pain or disk disorders.   Previous back injury or surgery (especially fusion).   Poor posture with lifting, especially heavy objects.   Prolonged sitting, especially with poor posture.  PREVENTION   Learn and use proper posture when sitting or lifting (maintain proper posture when sitting, lift using the knees and legs, not at the waist).   Warm up and stretch properly before activity.   Allow for adequate recovery between workouts.   Maintain physical fitness:   Strength, flexibility, and endurance.   Cardiovascular fitness.  PROGNOSIS  If treated properly, lower back strains usually heal  within 6 weeks. RELATED COMPLICATIONS   Recurring symptoms, resulting in a chronic problem.   Chronic inflammation, scarring, and partial muscle-tendon tear.   Delayed healing or resolution of symptoms.   Prolonged disability.  TREATMENT  Treatment first involves the use of ice and medicine, to reduce pain and inflammation. The use of strengthening and stretching exercises may help reduce pain with activity. These exercises may be performed at home or with a therapist. Severe injuries may require referral to a therapist for further evaluation and treatment, such as ultrasound. Your caregiver may advise that you wear a back brace or corset, to help reduce pain and discomfort. Often, prolonged bed rest results in greater harm then benefit. Corticosteroid injections may be recommended. However, these should be reserved for the most serious cases. It is important to avoid using your back when lifting objects. At night, sleep on your back on a firm mattress with a pillow placed under your knees. If non-surgical treatment is unsuccessful, surgery may be needed.  MEDICATION   If pain medicine is needed, nonsteroidal anti-inflammatory medicines (aspirin and ibuprofen), or other minor pain relievers (acetaminophen), are often advised.   Do not take pain medicine for 7 days before surgery.   Prescription pain relievers may be given, if your caregiver thinks they are needed. Use only as directed and only as much as you need.   Ointments applied to the skin may be helpful.   Corticosteroid injections may be given by your caregiver. These injections should be reserved for the most serious cases, because they may only be given a certain number of times.  HEAT AND COLD  Cold treatment (icing) should be applied for 10 to 15 minutes every 2 to 3 hours for inflammation and pain, and immediately after activity that aggravates your symptoms. Use ice packs or an ice massage.   Heat treatment may be used before  performing stretching and strengthening activities prescribed by your caregiver, physical therapist, or athletic trainer. Use a heat pack or a warm water soak.  SEEK MEDICAL CARE IF:   Symptoms get worse or do not improve in 2 to 4 weeks, despite treatment.   You develop numbness, weakness, or loss of bowel or bladder function.   New, unexplained symptoms develop. (Drugs used in treatment may produce side effects.)  EXERCISES  RANGE OF MOTION (ROM) AND STRETCHING EXERCISES - Low Back Strain Most people with lower back pain will find that their symptoms get worse with excessive bending forward (flexion) or arching at the lower back (extension). The exercises which will help resolve your symptoms will focus on the opposite motion.  Your physician, physical therapist or athletic trainer will help you determine which exercises will be most helpful to resolve your lower back pain. Do not complete any exercises without first consulting with your caregiver. Discontinue any exercises which make your symptoms worse until you speak to your caregiver.  If you have pain, numbness or tingling which travels down into your buttocks, leg or foot, the goal of the therapy is for these symptoms to move closer to your back and eventually resolve. Sometimes, these leg symptoms will get better, but your lower back pain may worsen. This is typically an indication of progress in your rehabilitation. Be very alert to any changes in your symptoms and the activities in which you participated in the 24 hours prior to the change. Sharing this information with your caregiver will allow him/her to most efficiently treat your condition.  These exercises may help you when beginning to rehabilitate your injury. Your symptoms may resolve with or without further involvement from your physician, physical therapist or athletic trainer. While completing these exercises, remember:   Restoring tissue flexibility helps normal motion to  return to the joints. This allows healthier, less painful movement and activity.   An effective stretch should be held for at least 30 seconds.   A stretch should never be painful. You should only feel a gentle lengthening or release in the stretched tissue.  FLEXION RANGE OF MOTION AND STRETCHING EXERCISES: STRETCH - Flexion, Single Knee to Chest   Lie on a firm bed or floor with both legs extended in front of you.   Keeping one leg in contact with the floor, bring your opposite knee to your chest. Hold your leg in place by either grabbing behind your thigh or at your knee.   Pull until you feel a gentle stretch in your lower back. Hold __________ seconds.   Slowly release your grasp and repeat the exercise with the opposite side.  Repeat __________ times. Complete this exercise __________ times per day.  STRETCH - Flexion, Double Knee to Chest   Lie on a firm bed or floor with both legs extended in front of you.   Keeping one leg in contact with the floor, bring your opposite knee to your chest.   Tense your stomach muscles to support your back and then lift your other knee to your chest. Hold your legs in place by either grabbing behind your thighs or at your knees.   Pull both knees toward your chest until you feel a gentle stretch in your lower back. Hold __________ seconds.   Tense your stomach muscles and slowly return one leg at a time to the floor.  Repeat __________ times. Complete this exercise __________ times per day.  STRETCH - Low Trunk Rotation  Lie on a firm bed or floor. Keeping your legs in front of you, bend your knees so they are both pointed toward the ceiling and your feet are flat on the floor.   Extend your arms out to the side. This will stabilize your upper body by keeping your shoulders in contact with the floor.   Gently and slowly drop both knees together to one side until you feel a gentle stretch in your lower back. Hold for __________ seconds.    Tense your stomach muscles to support your lower back as you bring your knees back to the starting position. Repeat the exercise to the other side.  Repeat __________ times. Complete this exercise __________ times per day  EXTENSION RANGE OF MOTION AND FLEXIBILITY EXERCISES: STRETCH - Extension, Prone on Elbows   Lie on your stomach on the floor, a bed will be too soft. Place your palms about shoulder width apart and at the height of your head.   Place your elbows under your shoulders. If this  is too painful, stack pillows under your chest.   Allow your body to relax so that your hips drop lower and make contact more completely with the floor.   Hold this position for __________ seconds.   Slowly return to lying flat on the floor.  Repeat __________ times. Complete this exercise __________ times per day.  RANGE OF MOTION - Extension, Prone Press Ups  Lie on your stomach on the floor, a bed will be too soft. Place your palms about shoulder width apart and at the height of your head.   Keeping your back as relaxed as possible, slowly straighten your elbows while keeping your hips on the floor. You may adjust the placement of your hands to maximize your comfort. As you gain motion, your hands will come more underneath your shoulders.   Hold this position __________ seconds.   Slowly return to lying flat on the floor.  Repeat __________ times. Complete this exercise __________ times per day.  RANGE OF MOTION- Quadruped, Neutral Spine   Assume a hands and knees position on a firm surface. Keep your hands under your shoulders and your knees under your hips. You may place padding under your knees for comfort.   Drop your head and point your tail bone toward the ground below you. This will round out your lower back like an angry cat. Hold this position for __________ seconds.   Slowly lift your head and release your tail bone so that your back sags into a large arch, like an old horse.    Hold this position for __________ seconds.   Repeat this until you feel limber in your lower back.   Now, find your "sweet spot." This will be the most comfortable position somewhere between the two previous positions. This is your neutral spine. Once you have found this position, tense your stomach muscles to support your lower back.   Hold this position for __________ seconds.  Repeat __________ times. Complete this exercise __________ times per day.  STRENGTHENING EXERCISES - Low Back Strain These exercises may help you when beginning to rehabilitate your injury. These exercises should be done near your "sweet spot." This is the neutral, low-back arch, somewhere between fully rounded and fully arched, that is your least painful position. When performed in this safe range of motion, these exercises can be used for people who have either a flexion or extension based injury. These exercises may resolve your symptoms with or without further involvement from your physician, physical therapist or athletic trainer. While completing these exercises, remember:   Muscles can gain both the endurance and the strength needed for everyday activities through controlled exercises.   Complete these exercises as instructed by your physician, physical therapist or athletic trainer. Increase the resistance and repetitions only as guided.   You may experience muscle soreness or fatigue, but the pain or discomfort you are trying to eliminate should never worsen during these exercises. If this pain does worsen, stop and make certain you are following the directions exactly. If the pain is still present after adjustments, discontinue the exercise until you can discuss the trouble with your caregiver.  STRENGTHENING - Deep Abdominals, Pelvic Tilt  Lie on a firm bed or floor. Keeping your legs in front of you, bend your knees so they are both pointed toward the ceiling and your feet are flat on the floor.   Tense  your lower abdominal muscles to press your lower back into the floor. This motion will rotate your  pelvis so that your tail bone is scooping upwards rather than pointing at your feet or into the floor.   With a gentle tension and even breathing, hold this position for __________ seconds.  Repeat __________ times. Complete this exercise __________ times per day.  STRENGTHENING - Abdominals, Crunches   Lie on a firm bed or floor. Keeping your legs in front of you, bend your knees so they are both pointed toward the ceiling and your feet are flat on the floor. Cross your arms over your chest.   Slightly tip your chin down without bending your neck.   Tense your abdominals and slowly lift your trunk high enough to just clear your shoulder blades. Lifting higher can put excessive stress on the lower back and does not further strengthen your abdominal muscles.   Control your return to the starting position.  Repeat __________ times. Complete this exercise __________ times per day.  STRENGTHENING - Quadruped, Opposite UE/LE Lift   Assume a hands and knees position on a firm surface. Keep your hands under your shoulders and your knees under your hips. You may place padding under your knees for comfort.   Find your neutral spine and gently tense your abdominal muscles so that you can maintain this position. Your shoulders and hips should form a rectangle that is parallel with the floor and is not twisted.   Keeping your trunk steady, lift your right hand no higher than your shoulder and then your left leg no higher than your hip. Make sure you are not holding your breath. Hold this position __________ seconds.   Continuing to keep your abdominal muscles tense and your back steady, slowly return to your starting position. Repeat with the opposite arm and leg.  Repeat __________ times. Complete this exercise __________ times per day.  STRENGTHENING - Lower Abdominals, Double Knee Lift  Lie on a firm  bed or floor. Keeping your legs in front of you, bend your knees so they are both pointed toward the ceiling and your feet are flat on the floor.   Tense your abdominal muscles to brace your lower back and slowly lift both of your knees until they come over your hips. Be certain not to hold your breath.   Hold __________ seconds. Using your abdominal muscles, return to the starting position in a slow and controlled manner.  Repeat __________ times. Complete this exercise __________ times per day.  POSTURE AND BODY MECHANICS CONSIDERATIONS - Low Back Strain Keeping correct posture when sitting, standing or completing your activities will reduce the stress put on different body tissues, allowing injured tissues a chance to heal and limiting painful experiences. The following are general guidelines for improved posture. Your physician or physical therapist will provide you with any instructions specific to your needs. While reading these guidelines, remember:  The exercises prescribed by your provider will help you have the flexibility and strength to maintain correct postures.   The correct posture provides the best environment for your joints to work. All of your joints have less wear and tear when properly supported by a spine with good posture. This means you will experience a healthier, less painful body.   Correct posture must be practiced with all of your activities, especially prolonged sitting and standing. Correct posture is as important when doing repetitive low-stress activities (typing) as it is when doing a single heavy-load activity (lifting).  RESTING POSITIONS Consider which positions are most painful for you when choosing a resting position. If you  have pain with flexion-based activities (sitting, bending, stooping, squatting), choose a position that allows you to rest in a less flexed posture. You would want to avoid curling into a fetal position on your side. If your pain worsens with  extension-based activities (prolonged standing, working overhead), avoid resting in an extended position such as sleeping on your stomach. Most people will find more comfort when they rest with their spine in a more neutral position, neither too rounded nor too arched. Lying on a non-sagging bed on your side with a pillow between your knees, or on your back with a pillow under your knees will often provide some relief. Keep in mind, being in any one position for a prolonged period of time, no matter how correct your posture, can still lead to stiffness. PROPER SITTING POSTURE In order to minimize stress and discomfort on your spine, you must sit with correct posture. Sitting with good posture should be effortless for a healthy body. Returning to good posture is a gradual process. Many people can work toward this most comfortably by using various supports until they have the flexibility and strength to maintain this posture on their own. When sitting with proper posture, your ears will fall over your shoulders and your shoulders will fall over your hips. You should use the back of the chair to support your upper back. Your lower back will be in a neutral position, just slightly arched. You may place a small pillow or folded towel at the base of your lower back for support.  When working at a desk, create an environment that supports good, upright posture. Without extra support, muscles tire, which leads to excessive strain on joints and other tissues. Keep these recommendations in mind: CHAIR:  A chair should be able to slide under your desk when your back makes contact with the back of the chair. This allows you to work closely.   The chair's height should allow your eyes to be level with the upper part of your monitor and your hands to be slightly lower than your elbows.  BODY POSITION  Your feet should make contact with the floor. If this is not possible, use a foot rest.   Keep your ears over your  shoulders. This will reduce stress on your neck and lower back.  INCORRECT SITTING POSTURES  If you are feeling tired and unable to assume a healthy sitting posture, do not slouch or slump. This puts excessive strain on your back tissues, causing more damage and pain. Healthier options include:  Using more support, like a lumbar pillow.   Switching tasks to something that requires you to be upright or walking.   Talking a brief walk.   Lying down to rest in a neutral-spine position.  PROLONGED STANDING WHILE SLIGHTLY LEANING FORWARD  When completing a task that requires you to lean forward while standing in one place for a long time, place either foot up on a stationary 2-4 inch high object to help maintain the best posture. When both feet are on the ground, the lower back tends to lose its slight inward curve. If this curve flattens (or becomes too large), then the back and your other joints will experience too much stress, tire more quickly, and can cause pain. CORRECT STANDING POSTURES Proper standing posture should be assumed with all daily activities, even if they only take a few moments, like when brushing your teeth. As in sitting, your ears should fall over your shoulders and your shoulders should  fall over your hips. You should keep a slight tension in your abdominal muscles to brace your spine. Your tailbone should point down to the ground, not behind your body, resulting in an over-extended swayback posture.  INCORRECT STANDING POSTURES  Common incorrect standing postures include a forward head, locked knees and/or an excessive swayback. WALKING Walk with an upright posture. Your ears, shoulders and hips should all line-up. PROLONGED ACTIVITY IN A FLEXED POSITION When completing a task that requires you to bend forward at your waist or lean over a low surface, try to find a way to stabilize 3 out of 4 of your limbs. You can place a hand or elbow on your thigh or rest a knee on the  surface you are reaching across. This will provide you more stability so that your muscles do not fatigue as quickly. By keeping your knees relaxed, or slightly bent, you will also reduce stress across your lower back. CORRECT LIFTING TECHNIQUES DO :   Assume a wide stance. This will provide you more stability and the opportunity to get as close as possible to the object which you are lifting.   Tense your abdominals to brace your spine. Bend at the knees and hips. Keeping your back locked in a neutral-spine position, lift using your leg muscles. Lift with your legs, keeping your back straight.   Test the weight of unknown objects before attempting to lift them.   Try to keep your elbows locked down at your sides in order get the best strength from your shoulders when carrying an object.   Always ask for help when lifting heavy or awkward objects.  INCORRECT LIFTING TECHNIQUES DO NOT:   Lock your knees when lifting, even if it is a small object.   Bend and twist. Pivot at your feet or move your feet when needing to change directions.   Assume that you can safely pick up even a paper clip without proper posture.  Document Released: 11/12/2005 Document Revised: 11/01/2011 Document Reviewed: 02/24/2009 [facility] Patient Information 2012 [place], [place].

## 2012-06-28 ENCOUNTER — Emergency Department (HOSPITAL_COMMUNITY)
Admission: EM | Admit: 2012-06-28 | Discharge: 2012-06-28 | Disposition: A | Discharge status: Home / Self Care | Payer: No Typology Code available for payment source | Attending: Emergency Medicine | Admitting: Emergency Medicine

## 2012-06-28 ENCOUNTER — Encounter (HOSPITAL_COMMUNITY): Payer: Self-pay | Admitting: Emergency Medicine

## 2012-06-28 DIAGNOSIS — T148XXA Other injury of unspecified body region, initial encounter: Secondary | ICD-10-CM

## 2012-06-28 DIAGNOSIS — F319 Bipolar disorder, unspecified: Secondary | ICD-10-CM | POA: Insufficient documentation

## 2012-06-28 DIAGNOSIS — M549 Dorsalgia, unspecified: Secondary | ICD-10-CM | POA: Diagnosis present

## 2012-06-28 DIAGNOSIS — J45909 Unspecified asthma, uncomplicated: Secondary | ICD-10-CM | POA: Insufficient documentation

## 2012-06-28 DIAGNOSIS — M545 Low back pain, unspecified: Secondary | ICD-10-CM | POA: Insufficient documentation

## 2012-06-28 DIAGNOSIS — F17200 Nicotine dependence, unspecified, uncomplicated: Secondary | ICD-10-CM | POA: Diagnosis not present

## 2012-06-28 DIAGNOSIS — Z90710 Acquired absence of both cervix and uterus: Secondary | ICD-10-CM | POA: Insufficient documentation

## 2012-06-28 MED ORDER — PREDNISONE 10 MG PO TABS: 20.0000 mg | ORAL_TABLET | Freq: Every day | ORAL | Status: DC

## 2012-06-28 MED ORDER — DIAZEPAM 5 MG PO TABS: 5.0000 mg | ORAL_TABLET | Freq: Two times a day (BID) | ORAL | Status: AC

## 2012-06-28 MED ORDER — IBUPROFEN 600 MG PO TABS: 600.0000 mg | ORAL_TABLET | Freq: Four times a day (QID) | ORAL | Status: AC | PRN

## 2012-06-28 NOTE — ED Notes (Signed)
[facility] may 24th-- lower left back pain since, now radiates down leg. Rocking in chair on my arrival.
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
## 2012-08-22 ENCOUNTER — Emergency Department (HOSPITAL_COMMUNITY)
Admission: EM | Admit: 2012-08-22 | Discharge: 2012-08-22 | Disposition: A | Discharge status: Home / Self Care | Payer: Self-pay | Attending: Emergency Medicine | Admitting: Emergency Medicine

## 2012-08-22 ENCOUNTER — Encounter (HOSPITAL_COMMUNITY): Payer: Self-pay | Admitting: *Deleted

## 2012-08-22 ENCOUNTER — Emergency Department (HOSPITAL_COMMUNITY): Payer: Self-pay

## 2012-08-22 DIAGNOSIS — IMO0002 Reserved for concepts with insufficient information to code with codable children: Secondary | ICD-10-CM | POA: Insufficient documentation

## 2012-08-22 DIAGNOSIS — X58XXXA Exposure to other specified factors, initial encounter: Secondary | ICD-10-CM | POA: Insufficient documentation

## 2012-08-22 DIAGNOSIS — R071 Chest pain on breathing: Secondary | ICD-10-CM | POA: Insufficient documentation

## 2012-08-22 DIAGNOSIS — F17200 Nicotine dependence, unspecified, uncomplicated: Secondary | ICD-10-CM | POA: Insufficient documentation

## 2012-08-22 DIAGNOSIS — S29012A Strain of muscle and tendon of back wall of thorax, initial encounter: Secondary | ICD-10-CM

## 2012-08-22 DIAGNOSIS — R0789 Other chest pain: Secondary | ICD-10-CM

## 2012-08-22 DIAGNOSIS — F319 Bipolar disorder, unspecified: Secondary | ICD-10-CM | POA: Insufficient documentation

## 2012-08-22 DIAGNOSIS — J45909 Unspecified asthma, uncomplicated: Secondary | ICD-10-CM | POA: Insufficient documentation

## 2012-08-22 LAB — COMPREHENSIVE METABOLIC PANEL
BUN: 10 mg/dL (ref 6–23)
CO2: 30 mEq/L (ref 19–32)
Calcium: 8.8 mg/dL (ref 8.4–10.5)
Creatinine, Ser: 0.81 mg/dL (ref 0.50–1.10)
GFR calc Af Amer: >90 mL/min (ref >90)
GFR calc non Af Amer: >90 mL/min (ref >90)
Glucose, Bld: 93 mg/dL (ref 70–99)

## 2012-08-22 LAB — CBC
HCT: 35.8 % — ABNORMAL LOW (ref 36.0–46.0)
Hemoglobin: 12.3 g/dL (ref 12.0–15.0)
MCH: 31 pg (ref 26.0–34.0)
MCV: 90.2 fL (ref 78.0–100.0)
RBC: 3.97 MIL/uL (ref 3.87–5.11)

## 2012-08-22 LAB — POCT I-STAT TROPONIN I: Troponin i, poc: 0 ng/mL (ref 0.00–0.08)

## 2012-08-22 MED ORDER — HYDROCODONE-ACETAMINOPHEN 5-325 MG PO TABS: 1.0000 | ORAL_TABLET | ORAL | Status: DC | PRN

## 2012-08-22 MED ORDER — OXYCODONE-ACETAMINOPHEN 5-325 MG PO TABS
1.0000 | ORAL_TABLET | Freq: Once | ORAL | Status: AC
  Administered 2012-08-22: 1 via ORAL
  Filled 2012-08-22: qty 1

## 2012-08-22 MED ORDER — DIAZEPAM 5 MG PO TABS: 5.0000 mg | ORAL_TABLET | Freq: Two times a day (BID) | ORAL | Status: DC

## 2012-08-22 MED ORDER — DIAZEPAM 5 MG PO TABS
5.0000 mg | ORAL_TABLET | Freq: Once | ORAL | Status: AC
  Administered 2012-08-22: 5 mg via ORAL
  Filled 2012-08-22: qty 1

## 2012-08-22 NOTE — ED Provider Notes (Signed)
History     CSN: [id number]  Arrival date & time 08/22/12  4098   First MD Initiated Contact with Patient 08/22/12 2202      Chief Complaint  Patient presents with  . Chest Pain    (Consider location/radiation/quality/duration/timing/severity/associated sxs/prior treatment) HPI Amanda Beck is a 41 y.o. female the noncontributory past medical history who presents with a days worth of left-sided chest pain. She says it feels like 8/10 a pulled muscle sensation, sharp, stabbing occasionally cramping, this is worse when she was at work today - she works in [employer] and has recently started working there over the past 4 weeks. Patient also relates a history of coughing for one month it started as a URI and then persisted with a cough but became better about 2 weeks ago. Pain came on last night about 8:00 she was uncomfortable and was able to sleep and it worsened during the workday yesterday. Patient has tried ibuprofen plus Zanaflex without much relief. Patient continues to smoke 3-4 cigarettes daily.  Patient denies any hemoptysis, denies exogenous female hormones, no leg pain, no recent history of immobilization, no personal or family history of DVT or PE. No history of coronary artery disease/ACS in a young first degree relative.   Past Medical History  Diagnosis Date  . Asthma   . Bipolar 1 disorder     Past Surgical History  Procedure Date  . Cesarean section   . Abdominal hysterectomy   . Knee surgery     left    No family history on file.  History  Substance Use Topics  . Smoking status: Current Every Day Smoker  . Smokeless tobacco: Not on file  . Alcohol Use: Yes    OB History    Grav Para Term Preterm Abortions TAB SAB Ect Mult Living                  Review of Systems At least 10pt or greater review of systems completed and are negative except where specified in the HPI.  Allergies  Bee venom  Home Medications   Current Outpatient Rx  Name Route  Sig Dispense Refill  . ALBUTEROL SULFATE HFA 108 (90 BASE) MCG/ACT IN AERS Inhalation Inhale 2 puffs into the lungs every 6 (six) hours as needed. For shortness of breath    . AMOXICILLIN 500 MG PO CAPS Oral Take 500 mg by mouth 3 (three) times daily.    . IBUPROFEN 200 MG PO TABS Oral Take 600-800 mg by mouth every 6 (six) hours as needed. PAIN    . TIZANIDINE HCL 2 MG PO TABS Oral Take 2 mg by mouth every 6 (six) hours as needed. For muscle pain.      BP 128/71  Pulse 62  Temp 98.2 F (36.8 C) (Oral)  Resp 16  SpO2 100%  Physical Exam  Nursing notes reviewed.  Electronic medical record reviewed. VITAL SIGNS:   Filed Vitals:   08/22/12 1908 08/22/12 2322  BP: 128/71 141/76  Pulse: 62 56  Temp: 98.2 F (36.8 C)   TempSrc: Oral   Resp: 16 16  SpO2: 100% 100%   CONSTITUTIONAL: Awake, oriented, appears non-toxic HENT: Atraumatic, normocephalic, oral mucosa pink and moist, airway patent. Nares patent without drainage. External ears normal. EYES: Conjunctiva clear, EOMI, PERRLA NECK: Trachea midline, non-tender, supple CARDIOVASCULAR: Normal heart rate, Normal rhythm, No murmurs, rubs, gallops PULMONARY/CHEST: Clear to auscultation, no rhonchi, wheezes, or rales. Symmetrical breath sounds. Non-tender. ABDOMINAL: Non-distended, obese, soft, non-tender -  no rebound or guarding.  BS normal. NEUROLOGIC: Non-focal, moving all four extremities, no gross sensory or motor deficits. EXTREMITIES: No clubbing, cyanosis, or edema. Patient has tenderness to palpation along the posterior aspect of the proximal humerus along the insertion of the latissimus dorsi muscle. She also has pain in a C. shape distribution of the fourth or fifth rib interspace from her mid clavicular line in the anterior upper left side of the chest only around the fourth or fifth rib to the midaxillary line. This pain is completely reproducible. SKIN: Warm, Dry, No erythema, No rash  ED Course  Procedures (including  critical care time)  Labs Reviewed  CBC - Abnormal; Notable for the following:    WBC 12.9 (*)     HCT 35.8 (*)     All other components within normal limits  COMPREHENSIVE METABOLIC PANEL  LIPASE, BLOOD  POCT I-STAT TROPONIN I  LAB REPORT - SCANNED   Dg Chest 2 View  08/22/2012  *RADIOLOGY REPORT*  Clinical Data: Chest pain  CHEST - 2 VIEW  Comparison: 04/19/2012  Findings: Lungs are clear. No pleural effusion or pneumothorax.  Mild degenerative changes of the visualized thoracolumbar spine.  IMPRESSION: No evidence of acute cardiopulmonary disease.   Original Report Authenticated By: Charline Bills, M.D.      1. Chest wall pain   2. Strain of latissimus dorsi muscle       MDM  Amanda Beck is a 41 y.o. female with no pertinent medical history who presents with left-sided chest wall and latissimus dorsi strain. Labs were obtained per triage and included cardiac biomarker and an EKG. Cardiac biomarkers negative, EKG is a normal sinus rhythm. The rest of her labs are unremarkable. Patient has a slight elevation in her white count of 12.9, this is nonspecific in this context - and her chest x-ray was absolutely normal.  The emergent differential diagnosis of chest pain includes: Acute coronary syndrome, pericarditis, aortic dissection, pulmonary embolism, tension pneumothorax, and esophageal rupture. Patient's symptoms are certainly atypical and have been going on for over 24 hours with a negative troponin and a normal EKG so I doubt the patient has acute coronary syndrome, or pericarditis. In addition the patient is nontoxic in appearance and in no acute distress with stable vital signs making aortic dissection or tension pneumothorax much less likely (also not seen on chest x-ray). Patient is also PERC negative the low pretest probability for PE making that diagnosis highly unlikely. In addition, denies any history of vomiting-making esophageal rupture also very unlikely. Patient has  a benign presentation consistent with muscle strain likely secondary to new job and possibly also related to some coughing over the last month.  Patient is been reassured and we discharged home with appropriate pain medication and followup within a week. Patient should return to the emergency department for any concerning symptoms including exertional chest pain, shortness of breath or any other concerning symptoms             Jones Skene, MD 08/23/12 1442

## 2012-08-22 NOTE — ED Notes (Signed)
Pt c/o left sided chest pain off and on since this morning; sharp when hurts; increased with movement;

## 2013-06-14 ENCOUNTER — Emergency Department (HOSPITAL_COMMUNITY)
Admission: EM | Admit: 2013-06-14 | Discharge: 2013-06-14 | Disposition: A | Discharge status: Home / Self Care | Payer: Self-pay | Attending: Emergency Medicine | Admitting: Emergency Medicine

## 2013-06-14 ENCOUNTER — Encounter (HOSPITAL_COMMUNITY): Payer: Self-pay | Admitting: Emergency Medicine

## 2013-06-14 DIAGNOSIS — F17200 Nicotine dependence, unspecified, uncomplicated: Secondary | ICD-10-CM | POA: Insufficient documentation

## 2013-06-14 DIAGNOSIS — Z8659 Personal history of other mental and behavioral disorders: Secondary | ICD-10-CM | POA: Insufficient documentation

## 2013-06-14 DIAGNOSIS — M5432 Sciatica, left side: Secondary | ICD-10-CM

## 2013-06-14 DIAGNOSIS — Z79899 Other long term (current) drug therapy: Secondary | ICD-10-CM | POA: Insufficient documentation

## 2013-06-14 DIAGNOSIS — M5430 Sciatica, unspecified side: Secondary | ICD-10-CM | POA: Insufficient documentation

## 2013-06-14 DIAGNOSIS — J45909 Unspecified asthma, uncomplicated: Secondary | ICD-10-CM | POA: Insufficient documentation

## 2013-06-14 HISTORY — DX: Dorsalgia, unspecified: M54.9

## 2013-06-14 MED ORDER — METHOCARBAMOL 500 MG PO TABS: 500.0000 mg | ORAL_TABLET | Freq: Two times a day (BID) | ORAL | Status: DC

## 2013-06-14 MED ORDER — KETOROLAC TROMETHAMINE 60 MG/2ML IM SOLN
60.0000 mg | Freq: Once | INTRAMUSCULAR | Status: AC
  Administered 2013-06-14: 60 mg via INTRAMUSCULAR
  Filled 2013-06-14: qty 2

## 2013-06-14 MED ORDER — CYCLOBENZAPRINE HCL 10 MG PO TABS: 10.0000 mg | ORAL_TABLET | Freq: Two times a day (BID) | ORAL | Status: DC | PRN

## 2013-06-14 MED ORDER — IBUPROFEN 800 MG PO TABS: 800.0000 mg | ORAL_TABLET | Freq: Three times a day (TID) | ORAL | Status: DC

## 2013-06-14 MED ORDER — PREDNISONE 20 MG PO TABS: 40.0000 mg | ORAL_TABLET | Freq: Every day | ORAL | Status: DC

## 2013-06-14 NOTE — ED Provider Notes (Signed)
History  This chart was scribed for non-physician practitioner Roxy Horseman, PA-C working with Raeford Razor, MD, by Candelaria Stagers, ED Scribe. This patient was seen in room WTR9/WTR9 and the patient's care was started at 8:02 PM  CSN: [id number] Arrival date & time 06/14/13  1945  First MD Initiated Contact with Patient 06/14/13 2000     Chief Complaint  Patient presents with  . Back Pain    The history is provided by the patient. No language interpreter was used.   HPI Comments: Amanda Beck is a 42 y.o. female who presents to the Emergency Department complaining of chronic left lower back pain that became exacerbated three days ago and has gradually worsened.  Pt states she is experiencing radiation down the back of her left leg.  She describes the pain as 8/10.  Pt denies loss of bowel or bladder control.  Pt has taken ibuprofen with no relief.  She reports that muscle relaxers have helped in the past.    Past Medical History  Diagnosis Date  . Asthma   . Bipolar 1 disorder   . Back pain    Past Surgical History  Procedure Laterality Date  . Cesarean section    . Abdominal hysterectomy    . Knee surgery      left   No family history on file. History  Substance Use Topics  . Smoking status: Current Every Day Smoker -- 0.25 packs/day for 5 years    Types: Cigarettes  . Smokeless tobacco: Never Used  . Alcohol Use: Yes     Comment: occasionally   OB History   Grav Para Term Preterm Abortions TAB SAB Ect Mult Living                 Review of Systems  Musculoskeletal: Positive for back pain (left lower back pain).  All other systems reviewed and are negative.    Allergies  Bee venom  Home Medications   Current Outpatient Rx  Name  Route  Sig  Dispense  Refill  . albuterol (PROVENTIL HFA;VENTOLIN HFA) 108 (90 BASE) MCG/ACT inhaler   Inhalation   Inhale 2 puffs into the lungs every 6 (six) hours as needed. For shortness of breath         .  amoxicillin (AMOXIL) 500 MG capsule   Oral   Take 500 mg by mouth 3 (three) times daily.         . diazepam (VALIUM) 5 MG tablet   Oral   Take 1 tablet (5 mg total) by mouth 2 (two) times daily.   11 tablet   0   . HYDROcodone-acetaminophen (NORCO/VICODIN) 5-325 MG per tablet   Oral   Take 1-2 tablets by mouth every 4 (four) hours as needed for pain.   13 tablet   0   . ibuprofen (ADVIL,MOTRIN) 200 MG tablet   Oral   Take 600-800 mg by mouth every 6 (six) hours as needed. PAIN         . tiZANidine (ZANAFLEX) 2 MG tablet   Oral   Take 2 mg by mouth every 6 (six) hours as needed. For muscle pain.          BP 148/86  Pulse 80  Temp(Src) 98.1 F (36.7 C) (Oral)  Resp 20  Ht 5\' 9"  (1.753 m)  Wt 255 lb (115.667 kg)  BMI 37.64 kg/m2  SpO2 98% Physical Exam  Nursing note and vitals reviewed. Constitutional: She is oriented to person, place,  and time. She appears well-developed and well-nourished. No distress.  HENT:  Head: Normocephalic and atraumatic.  Eyes: Conjunctivae and EOM are normal. Right eye exhibits no discharge. Left eye exhibits no discharge. No scleral icterus.  Neck: Normal range of motion. Neck supple. No tracheal deviation present.  Cardiovascular: Normal rate, regular rhythm and normal heart sounds.  Exam reveals no gallop and no friction rub.   No murmur heard. Pulmonary/Chest: Effort normal and breath sounds normal. No respiratory distress. She has no wheezes.  Abdominal: Soft. She exhibits no distension. There is no tenderness.  Musculoskeletal: Normal range of motion.  Left sided paraspinal muscles tender to palpation, no bony tenderness, step-offs, or gross abnormality or deformity of spine, patient is able to ambulate, moves all extremities  Neurological: She is alert and oriented to person, place, and time.  Sensation and strength intact bilaterally  Skin: Skin is warm and dry. She is not diaphoretic.  Psychiatric: She has a normal mood and  affect. Her behavior is normal. Judgment and thought content normal.    ED Course  Procedures   DIAGNOSTIC STUDIES: Oxygen Saturation is 98% on room air, normal by my interpretation.    COORDINATION OF CARE:  8:03 PM Discussed course of care with pt which includes steroids, muscle relaxer, and pain medication.  Advised pt to follow up with physical therapy.  Pt understands and agrees.   Labs Reviewed - No data to display No results found. 1. Sciatica, left     MDM  Patient with back pain.  No neurological deficits and normal neuro exam.  Patient can walk but states is painful.  No loss of bowel or bladder control.  No concern for cauda equina.  No fever, night sweats, weight loss, h/o cancer, IVDU.  RICE protocol and pain medicine indicated and discussed with patient.   I personally performed the services described in this documentation, which was scribed in my presence. The recorded information has been reviewed and is accurate.     Roxy Horseman, PA-C 06/14/13 2146

## 2013-06-14 NOTE — ED Notes (Signed)
Pt states that she began having LL back pain that radiates down her left leg two days ago. Pt is ambulatory and in NAD.

## 2013-06-18 NOTE — ED Provider Notes (Signed)
Medical screening examination/treatment/procedure(s) were performed by non-physician practitioner and as supervising physician I was immediately available for consultation/collaboration.  Demonte Dobratz, MD 06/18/13 2324 

## 2013-07-15 ENCOUNTER — Encounter (HOSPITAL_COMMUNITY): Payer: Self-pay | Admitting: Emergency Medicine

## 2013-07-15 ENCOUNTER — Emergency Department (HOSPITAL_COMMUNITY)
Admission: EM | Admit: 2013-07-15 | Discharge: 2013-07-15 | Disposition: A | Discharge status: Home / Self Care | Payer: Self-pay | Attending: Emergency Medicine | Admitting: Emergency Medicine

## 2013-07-15 ENCOUNTER — Emergency Department (HOSPITAL_COMMUNITY): Payer: Self-pay

## 2013-07-15 DIAGNOSIS — Z8669 Personal history of other diseases of the nervous system and sense organs: Secondary | ICD-10-CM | POA: Insufficient documentation

## 2013-07-15 DIAGNOSIS — Z79899 Other long term (current) drug therapy: Secondary | ICD-10-CM | POA: Insufficient documentation

## 2013-07-15 DIAGNOSIS — J45909 Unspecified asthma, uncomplicated: Secondary | ICD-10-CM | POA: Insufficient documentation

## 2013-07-15 DIAGNOSIS — S6990XA Unspecified injury of unspecified wrist, hand and finger(s), initial encounter: Secondary | ICD-10-CM | POA: Insufficient documentation

## 2013-07-15 DIAGNOSIS — F17200 Nicotine dependence, unspecified, uncomplicated: Secondary | ICD-10-CM | POA: Insufficient documentation

## 2013-07-15 DIAGNOSIS — Y929 Unspecified place or not applicable: Secondary | ICD-10-CM | POA: Insufficient documentation

## 2013-07-15 DIAGNOSIS — M79642 Pain in left hand: Secondary | ICD-10-CM

## 2013-07-15 DIAGNOSIS — Z8659 Personal history of other mental and behavioral disorders: Secondary | ICD-10-CM | POA: Insufficient documentation

## 2013-07-15 DIAGNOSIS — Y9389 Activity, other specified: Secondary | ICD-10-CM | POA: Insufficient documentation

## 2013-07-15 DIAGNOSIS — W230XXA Caught, crushed, jammed, or pinched between moving objects, initial encounter: Secondary | ICD-10-CM | POA: Insufficient documentation

## 2013-07-15 MED ORDER — TRAMADOL HCL 50 MG PO TABS
50.0000 mg | ORAL_TABLET | Freq: Once | ORAL | Status: AC
  Administered 2013-07-15: 50 mg via ORAL
  Filled 2013-07-15: qty 1

## 2013-07-15 MED ORDER — TRAMADOL HCL 50 MG PO TABS: 50.0000 mg | ORAL_TABLET | Freq: Four times a day (QID) | ORAL | Status: DC | PRN

## 2013-07-15 NOTE — ED Provider Notes (Signed)
CSN: [id number]     Arrival date & time 07/15/13  1153 History     First MD Initiated Contact with Patient 07/15/13 1300     Chief Complaint  Patient presents with  . Arm Pain   (Consider location/radiation/quality/duration/timing/severity/associated sxs/prior Treatment) HPI Comments: Patient is a 42 year old female past medical history significant for asthma, bipolar 1 disorder, back pain, carpal syndrome presented to the emergency department for left hand pain that has been worsening over the last 3 or 4 days. Patient states her left hand with moderate intermittent burning sensation in her palm and fingers worse with wrist flexion and making a fist. 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 She denies any fall onto the hand. Patient does endorse that she felt "a lump" in her left hand below her middle finger she has been trying to ice and rest. Patient does not elicit any more details regarding injury or event.  Patient is a 42 y.o. female presenting with arm pain.  Arm Pain Associated symptoms include arthralgias and myalgias. Pertinent negatives include no chills or fever.    Past Medical History  Diagnosis Date  . Asthma   . Bipolar 1 disorder   . Back pain    Past Surgical History  Procedure Laterality Date  . Cesarean section    . Abdominal hysterectomy    . Knee surgery      left   No family history on file. History  Substance Use Topics  . Smoking status: Current Every Day Smoker -- 0.25 packs/day for 5 years    Types: Cigarettes  . Smokeless tobacco: Never Used  . Alcohol Use: Yes     Comment: occasionally   OB History   Grav Para Term Preterm Abortions TAB SAB Ect Mult Living                 Review of Systems  Constitutional: Negative for fever and chills.  Musculoskeletal: Positive for myalgias and arthralgias.  Skin: Negative.     Allergies  Bee venom  Home Medications   Current Outpatient Rx  Name  Route  Sig   Dispense  Refill  . albuterol (PROVENTIL HFA;VENTOLIN HFA) 108 (90 BASE) MCG/ACT inhaler   Inhalation   Inhale 2 puffs into the lungs every 6 (six) hours as needed. For shortness of breath         . cyclobenzaprine (FLEXERIL) 10 MG tablet   Oral   Take 1 tablet (10 mg total) by mouth 2 (two) times daily as needed for muscle spasms.   20 tablet   0   . ibuprofen (ADVIL,MOTRIN) 200 MG tablet   Oral   Take 600-800 mg by mouth every 6 (six) hours as needed. PAIN         . ibuprofen (ADVIL,MOTRIN) 800 MG tablet   Oral   Take 1 tablet (800 mg total) by mouth 3 (three) times daily.   21 tablet   0   . traMADol (ULTRAM) 50 MG tablet   Oral   Take 1 tablet (50 mg total) by mouth every 6 (six) hours as needed for pain.   15 tablet   0    BP 133/75  Pulse 64  Temp(Src) 97.8 F (36.6 C) (Oral)  Resp 18  SpO2 98% Physical Exam  Constitutional: She is oriented to person, place, and time. She appears well-developed and well-nourished. No distress.  HENT:  Head: Normocephalic and atraumatic.  Right Ear: External ear normal.  Left Ear: External ear normal.  Nose: Nose normal.  Mouth/Throat: Oropharynx is clear and moist.  Eyes: Conjunctivae are normal.  Neck: Neck supple.  Cardiovascular: Normal rate, regular rhythm, normal heart sounds and intact distal pulses.   Musculoskeletal: Normal range of motion.       Right hand: Normal.       Left hand: She exhibits normal range of motion, no bony tenderness, normal two-point discrimination, normal capillary refill, no deformity, no laceration and no swelling. Normal sensation noted. Normal strength noted.       Hands: Small mobile cyst felt under L middle finger.   Neurological: She is alert and oriented to person, place, and time.  Skin: Skin is warm and dry. She is not diaphoretic.  Psychiatric: She has a normal mood and affect.    ED Course   Procedures (including critical care time)  Labs Reviewed - No data to display Dg  Hand Complete Left  07/15/2013   CLINICAL DATA:  42 year old female with acute onset of pain and palpable abnormality at the 4th MCP joint.  EXAM: LEFT HAND - COMPLETE 3+ VIEW  COMPARISON:  None.  FINDINGS: There is no evidence of fracture or dislocation. Small chronic osteophytic fragments at the radial base of the 2nd and 3rd proximal phalanges. Fourth MCP joint within normal limits. There is no evidence of arthropathy or other focal bone abnormality. Soft tissues are unremarkable  IMPRESSION: No acute osseous abnormality identified.   Electronically Signed   By: Augusto Gamble   On: 07/15/2013 13:42   1. Left hand pain   2. H/O carpal tunnel syndrome     MDM  Imaging shows no fracture. Directed pt to ice injury, take acetaminophen or ibuprofen for pain, and to elevate and rest the injury when possible. Splinted wrist for support and comfort. Provided back to work note for Sunday. Advised ortho f/u. Return precautions discussed. Patient is agreeable to plan. Patient is stable at time of discharge     Jeannetta Ellis, PA-C 07/15/13 1641

## 2013-07-15 NOTE — ED Notes (Signed)
Pt c/o l hand lump, that's causing numbness that radiates up forearm

## 2013-07-15 NOTE — ED Notes (Signed)
Pt states that she has been having hand pain x 3 or 4 days and states that it sometimes goes numb.  States she injured her hand while closing a door.  Her story of that event is vague.

## 2013-07-15 NOTE — Progress Notes (Signed)
[facility] did not get to see patient but will be sending her information about the [facility] [employer] Card program, using the address provided.  °

## 2013-07-16 NOTE — ED Provider Notes (Signed)
Medical screening examination/treatment/procedure(s) were performed by non-physician practitioner and as supervising physician I was immediately available for consultation/collaboration.    Salah Burlison R Tekelia Kareem, MD 07/16/13 1538 

## 2013-09-16 ENCOUNTER — Other Ambulatory Visit: Payer: Self-pay | Admitting: Chiropractic Medicine

## 2013-09-16 DIAGNOSIS — M545 Low back pain: Secondary | ICD-10-CM

## 2013-09-23 ENCOUNTER — Ambulatory Visit
Admission: RE | Admit: 2013-09-23 | Discharge: 2013-09-23 | Disposition: A | Discharge status: Home / Self Care | Payer: Medicaid - Out of State | Source: Ambulatory Visit | Attending: Chiropractic Medicine | Admitting: Chiropractic Medicine

## 2013-09-23 DIAGNOSIS — M545 Low back pain: Secondary | ICD-10-CM

## 2014-05-12 ENCOUNTER — Emergency Department (HOSPITAL_COMMUNITY): Payer: Self-pay

## 2014-05-12 ENCOUNTER — Encounter (HOSPITAL_COMMUNITY): Payer: Self-pay | Admitting: Emergency Medicine

## 2014-05-12 ENCOUNTER — Emergency Department (HOSPITAL_COMMUNITY)
Admission: EM | Admit: 2014-05-12 | Discharge: 2014-05-12 | Disposition: A | Discharge status: Home / Self Care | Payer: Self-pay | Attending: Emergency Medicine | Admitting: Emergency Medicine

## 2014-05-12 ENCOUNTER — Emergency Department (HOSPITAL_COMMUNITY): Payer: Medicaid - Out of State

## 2014-05-12 DIAGNOSIS — J45909 Unspecified asthma, uncomplicated: Secondary | ICD-10-CM | POA: Insufficient documentation

## 2014-05-12 DIAGNOSIS — R0789 Other chest pain: Secondary | ICD-10-CM | POA: Insufficient documentation

## 2014-05-12 DIAGNOSIS — F17200 Nicotine dependence, unspecified, uncomplicated: Secondary | ICD-10-CM | POA: Insufficient documentation

## 2014-05-12 DIAGNOSIS — Z8659 Personal history of other mental and behavioral disorders: Secondary | ICD-10-CM | POA: Insufficient documentation

## 2014-05-12 LAB — CBC
HEMATOCRIT: 39.4 % (ref 36.0–46.0)
HEMOGLOBIN: 13.7 g/dL (ref 12.0–15.0)
MCH: 31.4 pg (ref 26.0–34.0)
MCHC: 34.8 g/dL (ref 30.0–36.0)
MCV: 90.2 fL (ref 78.0–100.0)
PLATELETS: 266 10*3/uL (ref 150–400)
RBC: 4.37 MIL/uL (ref 3.87–5.11)
RDW: 12.3 % (ref 11.5–15.5)
WBC: 9.6 10*3/uL (ref 4.0–10.5)

## 2014-05-12 LAB — BASIC METABOLIC PANEL
BUN: 17 mg/dL (ref 6–23)
CHLORIDE: 101 meq/L (ref 96–112)
CO2: 22 meq/L (ref 19–32)
CREATININE: 0.78 mg/dL (ref 0.50–1.10)
Calcium: 8.9 mg/dL (ref 8.4–10.5)
GFR calc Af Amer: >90 mL/min (ref >90)
GFR calc non Af Amer: >90 mL/min (ref >90)
GLUCOSE: 119 mg/dL — AB (ref 70–99)
Potassium: 4.3 mEq/L (ref 3.7–5.3)
Sodium: 138 mEq/L (ref 137–147)

## 2014-05-12 LAB — TROPONIN I

## 2014-05-12 LAB — D-DIMER, QUANTITATIVE (NOT AT ARMC): D DIMER QUANT: 0.56 ug{FEU}/mL — AB (ref 0.00–0.48)

## 2014-05-12 MED ORDER — SODIUM CHLORIDE 0.9 % IV SOLN
INTRAVENOUS | Status: DC
  Administered 2014-05-12: 19:00:00 via INTRAVENOUS

## 2014-05-12 MED ORDER — CYCLOBENZAPRINE HCL 10 MG PO TABS
10.0000 mg | ORAL_TABLET | Freq: Once | ORAL | Status: AC
  Administered 2014-05-12: 10 mg via ORAL
  Filled 2014-05-12: qty 1

## 2014-05-12 MED ORDER — IOHEXOL 350 MG/ML SOLN
100.0000 mL | Freq: Once | INTRAVENOUS | Status: AC | PRN
  Administered 2014-05-12: 100 mL via INTRAVENOUS

## 2014-05-12 MED ORDER — IBUPROFEN 800 MG PO TABS
800.0000 mg | ORAL_TABLET | Freq: Once | ORAL | Status: AC
  Administered 2014-05-12: 800 mg via ORAL
  Filled 2014-05-12: qty 1

## 2014-05-12 MED ORDER — NAPROXEN 500 MG PO TABS: 500.0000 mg | ORAL_TABLET | Freq: Two times a day (BID) | ORAL | Status: DC

## 2014-05-12 MED ORDER — CYCLOBENZAPRINE HCL 5 MG PO TABS: 5.0000 mg | ORAL_TABLET | Freq: Three times a day (TID) | ORAL | Status: DC | PRN

## 2014-05-12 NOTE — ED Provider Notes (Signed)
CSN: [id number]     Arrival date & time 05/12/14  1328 History   First MD Initiated Contact with Patient 05/12/14 1605     Chief Complaint  Patient presents with  . Chest Pain     (Consider location/radiation/quality/duration/timing/severity/associated sxs/prior Treatment) HPI Patient reports yesterday she had some intermittent chest pain. However today she has 2 types of chest pain. She has a constant dull pain in her left lateral chest that has been constant since 11 AM. She also has intermittent sharp pain that she describes as sharp and it only lasts a few minutes, she also feels like she has palpitations with the sharp pain. She states movement makes the pain worse. Laying on her right side makes the pain feel better. She has mild shortness of breath only with the pain. She denies cough, fever, diaphoresis. She has had mild nausea without vomiting. She denies any change in her activity although she did have a graduation for her daughter over the weekend she did not do anything to set it up. She denies being on any type of hormone replacement. She denies any type of wheezing. She states the pain starts in her left anterior chest and radiates around to her left back. She states her legs feel weak and tired and are mildly sore. However she has chronic left knee pain since she had surgery. She also had mild swelling of her ankles for one to 2 weeks. She states she's never had this pain before.  Family history she reports her maternal grandmother died age 41 from congestive heart failure  PCP none  Past Medical History  Diagnosis Date  . Asthma   . Bipolar 1 disorder   . Back pain    Past Surgical History  Procedure Laterality Date  . Cesarean section    . Abdominal hysterectomy    . Knee surgery      left   No family history on file. History  Substance Use Topics  . Smoking status: Current Every Day Smoker -- 0.25 packs/day for 5 years    Types: Cigarettes  . Smokeless tobacco:  Never Used  . Alcohol Use: Yes     Comment: occasionally   employed  OB History   Grav Para Term Preterm Abortions TAB SAB Ect Mult Living                 Review of Systems  All other systems reviewed and are negative.     Allergies  Bee venom  Home Medications   Prior to Admission medications   Medication Sig Start Date End Date Taking? Authorizing Provider  albuterol (PROVENTIL HFA;VENTOLIN HFA) 108 (90 BASE) MCG/ACT inhaler Inhale 2 puffs into the lungs every 6 (six) hours as needed for wheezing or shortness of breath.    Yes Historical Provider, MD  ibuprofen (ADVIL,MOTRIN) 200 MG tablet Take 600 mg by mouth every 6 (six) hours as needed for moderate pain.    Yes Historical Provider, MD   BP 127/69  Pulse 53  Temp(Src) 98.2 F (36.8 C) (Oral)  Resp 16  SpO2 99%  Vital signs normal   Physical Exam  Nursing note and vitals reviewed. Constitutional: She is oriented to person, place, and time. She appears well-developed and well-nourished.  Non-toxic appearance. She does not appear ill. No distress.  HENT:  Head: Normocephalic and atraumatic.  Right Ear: External ear normal.  Left Ear: External ear normal.  Nose: Nose normal. No mucosal edema or rhinorrhea.  Mouth/Throat: Oropharynx is  clear and moist and mucous membranes are normal. No dental abscesses or uvula swelling.  Eyes: Conjunctivae and EOM are normal. Pupils are equal, round, and reactive to light.  Neck: Normal range of motion and full passive range of motion without pain. Neck supple.  Cardiovascular: Normal rate, regular rhythm and normal heart sounds.  Exam reveals no gallop and no friction rub.   No murmur heard. Pulmonary/Chest: Effort normal and breath sounds normal. No respiratory distress. She has no wheezes. She has no rhonchi. She has no rales. She exhibits no tenderness and no crepitus.    Patient has no chest wall tenderness to palpation. There is no rash seen. Area of pain location noted    Abdominal: Soft. Normal appearance and bowel sounds are normal. She exhibits no distension. There is no tenderness. There is no rebound and no guarding.  Musculoskeletal: Normal range of motion. She exhibits no edema and no tenderness.  Moves all extremities well.   Neurological: She is alert and oriented to person, place, and time. She has normal strength. No cranial nerve deficit.  Skin: Skin is warm, dry and intact. No rash noted. No erythema. No pallor.  Psychiatric: She has a normal mood and affect. Her speech is normal and behavior is normal. Her mood appears not anxious.    ED Course  Procedures (including critical care time)  Medications  0.9 %  sodium chloride infusion ( Intravenous New Bag/Given 05/12/14 1833)  ibuprofen (ADVIL,MOTRIN) tablet 800 mg (800 mg Oral Given 05/12/14 1705)  cyclobenzaprine (FLEXERIL) tablet 10 mg (10 mg Oral Given 05/12/14 1705)  iohexol (OMNIPAQUE) 350 MG/ML injection 100 mL (100 mLs Intravenous Contrast Given 05/12/14 1929)     Labs Review Results for orders placed during the hospital encounter of 05/12/14  CBC      Result Value Ref Range   WBC 9.6  4.0 - 10.5 K/uL   RBC 4.37  3.87 - 5.11 MIL/uL   Hemoglobin 13.7  12.0 - 15.0 g/dL   HCT 16.139.4  09.636.0 - 04.546.0 %   MCV 90.2  78.0 - 100.0 fL   MCH 31.4  26.0 - 34.0 pg   MCHC 34.8  30.0 - 36.0 g/dL   RDW 40.912.3  81.111.5 - 91.415.5 %   Platelets 266  150 - 400 K/uL  BASIC METABOLIC PANEL      Result Value Ref Range   Sodium 138  137 - 147 mEq/L   Potassium 4.3  3.7 - 5.3 mEq/L   Chloride 101  96 - 112 mEq/L   CO2 22  19 - 32 mEq/L   Glucose, Bld 119 (*) 70 - 99 mg/dL   BUN 17  6 - 23 mg/dL   Creatinine, Ser 7.820.78  0.50 - 1.10 mg/dL   Calcium 8.9  8.4 - 95.610.5 mg/dL   GFR calc non Af Amer >90  >90 mL/min   GFR calc Af Amer >90  >90 mL/min  TROPONIN I      Result Value Ref Range   Troponin I <0.30  <0.30 ng/mL  D-DIMER, QUANTITATIVE      Result Value Ref Range   D-Dimer, Quant 0.56 (*) 0.00 - 0.48  ug/mL-FEU   Laboratory interpretation all normal except elevated ddimer      Imaging Review Dg Chest 2 View  05/12/2014   CLINICAL DATA:  Chest pain  EXAM: CHEST  2 VIEW  COMPARISON:  August 22, 2012  FINDINGS: Lungs are clear. Heart size and pulmonary vascularity are normal. No  adenopathy. There is mild degenerative change in the thoracic spine. No pneumothorax.  IMPRESSION: No edema or consolidation.   Electronically Signed   By: Bretta Bang M.D.   On: 05/12/2014 17:29     EKG Interpretation   Date/Time:  Wednesday May 12 2014 13:37:36 EDT Ventricular Rate:  52 PR Interval:  150 QRS Duration: 96 QT Interval:  438 QTC Calculation: 407 R Axis:   69 Text Interpretation:  Sinus bradycardia Otherwise within normal limits  Since last tracing rate slower Confirmed by KNAPP  MD-I, IVA ([id number]) on  05/12/2014 4:05:48 PM      MDM   Final diagnoses:  Atypical chest pain   New Prescriptions   CYCLOBENZAPRINE (FLEXERIL) 5 MG TABLET    Take 1 tablet (5 mg total) by mouth 3 (three) times daily as needed (chest wall pain).   NAPROXEN (NAPROSYN) 500 MG TABLET    Take 1 tablet (500 mg total) by mouth 2 (two) times daily with a meal.    Plan discharge  Devoria Albe, MD, Franz Dell, MD 05/12/14 2034

## 2014-05-12 NOTE — ED Notes (Signed)
Pt reports onset sharp L side chest pain that wraps around upper L rib cage yesterday while she was working, along with intermittent nausea. Pt denies doing any heavy lifting at onset, denies any known trauma to area. Tender to palpation. Pt denies SOB, dizziness, or any other sx.

## 2014-05-12 NOTE — ED Notes (Signed)
Pt states that she has been having lt sided sharp chest pain since yesterday.  Intermittent pain w/ palpitations.  Has had hx of palpitations but no pain associated normally.

## 2014-05-12 NOTE — Progress Notes (Signed)
[facility] CL provided pt with a list of primary care resources and a [facility] Orange Card application to help patient establish primary care.  °

## 2014-05-12 NOTE — Discharge Instructions (Signed)
Try ice and heat to your chest. Take the medications as prescribed. Recheck if you get a rash, get a fever, cough, feel short or breath or seem worse.

## 2014-07-25 ENCOUNTER — Encounter (HOSPITAL_COMMUNITY): Payer: Self-pay | Admitting: Emergency Medicine

## 2014-07-25 ENCOUNTER — Emergency Department (HOSPITAL_COMMUNITY)
Admission: EM | Admit: 2014-07-25 | Discharge: 2014-07-25 | Disposition: A | Discharge status: Home / Self Care | Payer: Medicaid - Out of State | Attending: Emergency Medicine | Admitting: Emergency Medicine

## 2014-07-25 DIAGNOSIS — M79609 Pain in unspecified limb: Secondary | ICD-10-CM | POA: Insufficient documentation

## 2014-07-25 DIAGNOSIS — M5430 Sciatica, unspecified side: Secondary | ICD-10-CM | POA: Insufficient documentation

## 2014-07-25 DIAGNOSIS — J45909 Unspecified asthma, uncomplicated: Secondary | ICD-10-CM | POA: Insufficient documentation

## 2014-07-25 DIAGNOSIS — Z8659 Personal history of other mental and behavioral disorders: Secondary | ICD-10-CM | POA: Insufficient documentation

## 2014-07-25 DIAGNOSIS — F17200 Nicotine dependence, unspecified, uncomplicated: Secondary | ICD-10-CM | POA: Insufficient documentation

## 2014-07-25 DIAGNOSIS — M5442 Lumbago with sciatica, left side: Secondary | ICD-10-CM

## 2014-07-25 DIAGNOSIS — Z90710 Acquired absence of both cervix and uterus: Secondary | ICD-10-CM | POA: Insufficient documentation

## 2014-07-25 DIAGNOSIS — Z79899 Other long term (current) drug therapy: Secondary | ICD-10-CM | POA: Insufficient documentation

## 2014-07-25 MED ORDER — NAPROXEN 500 MG PO TABS: 500.0000 mg | ORAL_TABLET | Freq: Two times a day (BID) | ORAL | Status: DC

## 2014-07-25 MED ORDER — HYDROCODONE-ACETAMINOPHEN 5-325 MG PO TABS: 1.0000 | ORAL_TABLET | ORAL | Status: DC | PRN

## 2014-07-25 MED ORDER — CYCLOBENZAPRINE HCL 10 MG PO TABS: 10.0000 mg | ORAL_TABLET | Freq: Every day | ORAL | Status: DC

## 2014-07-25 MED ORDER — OXYCODONE-ACETAMINOPHEN 5-325 MG PO TABS
1.0000 | ORAL_TABLET | Freq: Once | ORAL | Status: AC
  Administered 2014-07-25: 1 via ORAL
  Filled 2014-07-25: qty 1

## 2014-07-25 NOTE — Discharge Instructions (Signed)
Call for a follow up appointment with a Family or Primary Care Provider.  Return if Symptoms worsen.   Take medication as prescribed.  Do not operate heavy machinery or drink alcohol while taking Flexeril or Norco.  Emergency Department Resource Guide 1) Find a Doctor and Pay Out of Pocket Although you won't have to find out who is covered by your insurance plan, it is a good idea to ask around and get recommendations. You will then need to call the office and see if the doctor you have chosen will accept you as a new patient and what types of options they offer for patients who are self-pay. Some doctors offer discounts or will set up payment plans for their patients who do not have insurance, but you will need to ask so you aren't surprised when you get to your appointment.  2) Contact Your Local Health Department Not all health departments have doctors that can see patients for sick visits, but many do, so it is worth a call to see if yours does. If you don't know where your local health department is, you can check in your phone book. The CDC also has a tool to help you locate your state's health department, and many state websites also have listings of all of their local health departments.  3) Find a Walk-in Clinic If your illness is not likely to be very severe or complicated, you may want to try a walk in clinic. These are popping up all over the country in pharmacies, drugstores, and shopping centers. They're usually staffed by nurse practitioners or physician assistants that have been trained to treat common illnesses and complaints. They're usually fairly quick and inexpensive. However, if you have serious medical issues or chronic medical problems, these are probably not your best option.  No Primary Care Doctor: - Call Health Connect at  [phone] - they can help you locate a primary care doctor that  accepts your insurance, provides certain services, etc. - Physician Referral Service-  [phone]  Chronic Pain Problems: Organization         Address  Phone   Notes  [facility]  [phone] Patients need to be referred by their primary care doctor.   Medication Assistance: Organization         Address  Phone   Notes  [facility] Medication [facility] [address] [place] [place], [place] [address] [phone] --Must be a resident of [facility] -- Must have NO insurance coverage whatsoever (no Medicaid/ Medicare, etc.) -- The pt. MUST have a primary care doctor that directs their care regularly and follows them in the community   Med[facility]ist  [phone]   [employer]  [phone]    Agencies that provide inexpensive medical care: Organization         Address  Phone   Notes  [facility]  [phone]   [facility]    [phone]   [facility] [address] [place], [place] [address] [phone]   [facility] [place] [address] [place], [place] [phone]   Planned Parenthood    [phone]   [facility]    [phone]   [facility]  [address], [place] Phone:  [phone], Fax:  [phone] Hours of Operation:  9 am - 6 pm, M-F.  Also accepts Medicaid/Medicare and self-pay.  [facility]  [place] for [place], [address], [place] Phone: [phone], Fax: [phone]. Hours of Operation:  8:30 am - 5:30 pm, M-F.  Also accepts Medicaid and self-pay.  [facility] [address] [place] Phone: [phone]   [place], [place], [place] [phone], Ext. [phone] Mondays & Thursdays: 7-9 AM.  First 15 patients are seen on a first come, first serve basis.    [place] Providers:  Organization         Address  Phone   Notes  [facility] [address],  [place] [phone] Also accepts self-pay patients.  [facility] [id number] [place], [place]  [phone]   [place], [address], [place] [phone]   [facility] [address] [place] [phone]   Lucianne Lei [address], [place]   [phone] Only accepts [place] Access [place] patients after they have their name applied to their card.   Self-Pay (no insurance) in [facility]:  Organization         Address  Phone   Notes  Sickle Cell Patients, [facility] Internal Medicine [place] [phone]   [facility] [place] [phone]   [facility] [place]  [place], [place], [place] [phone]   [facility]/Dr. Osei-Bonsu  [address], [place] or [address], [place] [phone] Phone number for both [place] and [place] locations is the same.  [facility] [address] [place] [phone]   [facility]  [address], [place] or [address] [place] [address] [phone]   [facility] [address], [place] [phone], phone; [phone], fax Sees patients 1st and 3rd Saturday of every month.  Must not qualify for public or private insurance (i.e. Medicaid, Medicare, [place] Health Choice, Veterans' Benefits)  Household income should be no more than 200% of the poverty level The clinic cannot treat you if you are pregnant or think you are pregnant  Sexually transmitted diseases are not treated at the clinic.    Dental Care: Organization         Address  Phone  Notes  [facility] Department of [facility] [place] [phone] Accepts children up to age 40 who are enrolled in  [place] or [place]; pregnant women with a Medicaid card; and children who have applied for Medicaid or [place] Health Choice, but were declined, whose parents can pay a reduced fee at time of service.  [facility] Department of [facility]  [address], [place] [phone] Accepts children up to age 85 who are enrolled in [place] or [place]; pregnant women with a Medicaid card; and children who have applied for Medicaid or [place] Health Choice, but were declined, whose parents can pay a reduced fee at time of service.  [place] [facility] PRO[facility]  [place] [phone] Patients are seen by appointment only. Walk-ins are not accepted. [place] will see patients 62 years of age and older. Monday - Tuesday (8am-5pm) Most Wednesdays (8:30-5pm) $30 per visit,  cash only  [employer] Adult [employer] PROGRAM  [address], [place] [phone] Patients are seen by appointment only. Walk-ins are not accepted. [place] will see patients 68 years of age and older. One Wednesday Evening (Monthly: Volunteer Based).  $30 per visit, cash only  [place]  [phone] for adults; Children under age 32, call Graduate Pediatric Dentistry at [phone]. Children aged 36-14, please call [phone] to request a pediatric application.  Dental services are provided in all areas of dental care including fillings, crowns and bridges, complete and partial dentures, implants, gum treatment, root canals, and extractions. Preventive care is also provided. Treatment is provided to both adults and children. Patients are selected via a lottery and there is often a waiting list.   [facility] [address] [place]  [phone] [email or web address]   [facility] [address] [place], [place] [phone], Ext. [phone] Second and Fourth Thursday of each month, opens at 6:30  AM; Clinic ends at 9 AM.  Patients are seen on a first-come first-served basis, and a limited number are seen during each clinic.   [facility]  [address] [place] Danker [place], [place] [phone]   Eligibility Requirements You must have lived in [place], [place], or [place] counties for at least the last three months.   You cannot be eligible for state or federal sponsored [employer], including [employer], [place], or [employer].   You generally cannot be eligible for healthcare insurance through your employer.    How to apply: Eligibility screenings are held every Tuesday and Wednesday afternoon from 1:00 pm until 4:00 pm. You do not need an appointment for the interview!  [facility] [address] [place], [place]   [place]  [place] Department  [place]  [phone]    Behavioral Health Resources in the Community: Intensive Outpatient Programs Organization         Address  Phone  Notes  Bejou Flanagan. [address], [place], [place] [phone]   [facility] Outpatient [address] [place], [place]   ADS: Alcohol [facility] Drug [facility] [address] [place], [place]   [place] [address],  [place], [place] or [phone]   Substance Abuse Resources Organization         Address  Phone  Notes  Alcohol and Drug Services  [phone]   [place]  [phone]   [facility] [place]   Chinita Pester  [phone]   Residential & Outpatient Substance Abuse Program  [phone]   Psychological Services Organization         Address  Phone  Notes  [facility] [place]  [place]  [phone]   [place] [address] [place] or  [phone]    Mobile Crisis Teams Organization         Address  Phone  Notes  [facility], Mobile Crisis Care Unit  [phone]   [facility]  [address] [place], [place]   [place] [address], [place] [phone]    Self-Help/Support Groups Organization         Address  Phone  Notes  [facility] [place] - variety of support groups  [place] Call for more information  Narcotics Anonymous (NA), [facility] [address], [employer] [place]  2 meetings at this location   Special educational needs teacher         Address  Phone  Notes  [facility] Residential Treatment [place],    [place]  [phone]   [facility]  [address] [place] [phone], [place], [place]   [place], [place] [phone] Admissions: 8am-3pm M-F  Incentives Substance [place] [address]    [place], [place] [phone]   [facility] [address] [place], [place], [place]   [facility] [address],  [place], [place]   [facility] - Intensive Outpatient [address]een [address], [place], [place]   [facility] [address]  [place], [place] [phone] or [phone]   [facility] (RTS) [address] [place], [place] Accepts Medicaid  [facility] [place] [address],  [place] [phone] Substance Abuse/Addiction Treatment   [facility] Organization         Address  Phone  Notes  [facility]  [phone]   Domenic Schwab, PhD [address] Arlis Porta [place], [place]   [phone] or [phone]   [place] and [place], [place] [phone]   [facility] [address],  [place], [place] [phone] Insurance/Medicaid/sponsorship through [facility] and Families [address], [address] [place]                                    [place], [place] [phone] [place] [address], [place] [phone]    Dr. Adele Schilder  [phone]   [facility] Dept. 1) [address], [place] 2) [place] 3)  [place] [address], [place] [phone]  [phone]   [place] [phone] or [phone] (After Hours)
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
## 2014-07-29 ENCOUNTER — Encounter (HOSPITAL_COMMUNITY): Payer: Self-pay | Admitting: Emergency Medicine

## 2014-07-29 ENCOUNTER — Emergency Department (HOSPITAL_COMMUNITY)
Admission: EM | Admit: 2014-07-29 | Discharge: 2014-07-29 | Disposition: A | Discharge status: Home / Self Care | Payer: Medicaid - Out of State | Attending: Emergency Medicine | Admitting: Emergency Medicine

## 2014-07-29 DIAGNOSIS — E669 Obesity, unspecified: Secondary | ICD-10-CM | POA: Insufficient documentation

## 2014-07-29 DIAGNOSIS — M25559 Pain in unspecified hip: Secondary | ICD-10-CM | POA: Insufficient documentation

## 2014-07-29 DIAGNOSIS — M79609 Pain in unspecified limb: Secondary | ICD-10-CM | POA: Insufficient documentation

## 2014-07-29 DIAGNOSIS — Z8659 Personal history of other mental and behavioral disorders: Secondary | ICD-10-CM | POA: Insufficient documentation

## 2014-07-29 DIAGNOSIS — Z791 Long term (current) use of non-steroidal anti-inflammatories (NSAID): Secondary | ICD-10-CM | POA: Insufficient documentation

## 2014-07-29 DIAGNOSIS — J45909 Unspecified asthma, uncomplicated: Secondary | ICD-10-CM | POA: Insufficient documentation

## 2014-07-29 DIAGNOSIS — G8929 Other chronic pain: Secondary | ICD-10-CM | POA: Insufficient documentation

## 2014-07-29 DIAGNOSIS — M549 Dorsalgia, unspecified: Secondary | ICD-10-CM | POA: Insufficient documentation

## 2014-07-29 DIAGNOSIS — M5432 Sciatica, left side: Secondary | ICD-10-CM

## 2014-07-29 DIAGNOSIS — F17200 Nicotine dependence, unspecified, uncomplicated: Secondary | ICD-10-CM | POA: Insufficient documentation

## 2014-07-29 DIAGNOSIS — M5430 Sciatica, unspecified side: Secondary | ICD-10-CM | POA: Insufficient documentation

## 2014-07-29 DIAGNOSIS — Z79899 Other long term (current) drug therapy: Secondary | ICD-10-CM | POA: Insufficient documentation

## 2014-07-29 MED ORDER — HYDROCODONE-ACETAMINOPHEN 5-325 MG PO TABS: 1.0000 | ORAL_TABLET | Freq: Four times a day (QID) | ORAL | Status: DC | PRN

## 2014-07-29 MED ORDER — CYCLOBENZAPRINE HCL 10 MG PO TABS: 10.0000 mg | ORAL_TABLET | Freq: Three times a day (TID) | ORAL | Status: DC | PRN

## 2014-07-29 MED ORDER — DEXAMETHASONE SODIUM PHOSPHATE 10 MG/ML IJ SOLN
10.0000 mg | Freq: Once | INTRAMUSCULAR | Status: AC
  Administered 2014-07-29: 10 mg via INTRAMUSCULAR
  Filled 2014-07-29: qty 1

## 2014-07-29 MED ORDER — NAPROXEN 500 MG PO TABS: 500.0000 mg | ORAL_TABLET | Freq: Two times a day (BID) | ORAL | Status: DC | PRN

## 2014-07-29 NOTE — ED Notes (Signed)
Pt c/o sciatica that is effecting her left leg. Pt states that has been going on for 5 days.  Pt states that she was seen here for it but hasnt gotten any better.

## 2014-07-29 NOTE — Discharge Instructions (Signed)
Use heat to the side of your hip to help with your pain. As for your Back Pain: Your back pain should be treated with medicines such as ibuprofen or aleve and this back pain should get better over the next 2 weeks.  However if you develop severe or worsening pain, low back pain with fever, numbness, weakness or inability to walk or urinate, you should return to the ER immediately.  Please follow up with your doctor this week for a recheck if still having symptoms.  Low back pain is discomfort in the lower back that may be due to injuries to muscles and ligaments around the spine.  Occasionally, it may be caused by a a problem to a part of the spine called a disc.  The pain may last several days or a week;  However, most patients get completely well in 4 weeks.  Self - care:  The application of heat can help soothe the pain.  Maintaining your daily activities, including walking, is encourged, as it will help you get better faster than just staying in bed. Perform gentle stretching as discussed. Drink plenty of fluids.  Medications are also useful to help with pain control.  A commonly prescribed medications includes norco, don't drive or use heavy machinery when taking this medication.  Non steroidal anti inflammatory medications including Ibuprofen and naproxen;  These medications help both pain and swelling and are very useful in treating back pain.  They should be taken with food, as they can cause stomach upset, and more seriously, stomach bleeding.    Muscle relaxants (flexeril):  These medications can help with muscle tightness that is a cause of lower back pain.  Most of these medications can cause drowsiness, and it is not safe to drive or use dangerous machinery while taking them.  SEEK IMMEDIATE MEDICAL ATTENTION IF: New numbness, tingling, weakness, or problem with the use of your arms or legs.  Severe back pain not relieved with medications.  Difficulty with or loss of control of your  bowel or bladder control.  Increasing pain in any areas of the body (such as chest or abdominal pain).  Shortness of breath, dizziness or fainting.  Nausea (feeling sick to your stomach), vomiting, fever, or sweats.  You will need to follow up with  Your primary healthcare provider in 1-2 weeks for reassessment. See the community [facility] wellness center if you don't have a doctor here. See the orthopedist when you can to have ongoing management of your chronic back pain   Chronic Back Pain  When back pain lasts longer than 3 months, it is called chronic back pain.People with chronic back pain often go through certain periods that are more intense (flare-ups).  CAUSES Chronic back pain can be caused by wear and tear (degeneration) on different structures in your back. These structures include:  The bones of your spine (vertebrae) and the joints surrounding your spinal cord and nerve roots (facets).  The strong, fibrous tissues that connect your vertebrae (ligaments). Degeneration of these structures may result in pressure on your nerves. This can lead to constant pain. HOME CARE INSTRUCTIONS  Avoid bending, heavy lifting, prolonged sitting, and activities which make the problem worse.  Take brief periods of rest throughout the day to reduce your pain. Lying down or standing usually is better than sitting while you are resting.  Take over-the-counter or prescription medicines only as directed by your caregiver. SEEK IMMEDIATE MEDICAL CARE IF:   You have weakness or numbness  in one of your legs or feet.  You have trouble controlling your bladder or bowels.  You have nausea, vomiting, abdominal pain, shortness of breath, or fainting. Document Released: 12/20/2004 Document Revised: 02/04/2012 Document Reviewed: 10/27/2011 [facility] Patient Information 2015 [place], [place]. This information is not intended to replace advice given to you by your health care provider. Make sure you discuss  any questions you have with your health care provider.  Back Exercises Back exercises help treat and prevent back injuries. The goal is to increase your strength in your belly (abdominal) and back muscles. These exercises can also help with flexibility. Start these exercises when told by your doctor. HOME CARE Back exercises include: Pelvic Tilt.  Lie on your back with your knees bent. Tilt your pelvis until the lower part of your back is against the floor. Hold this position 5 to 10 sec. Repeat this exercise 5 to 10 times. Knee to Chest.  Pull 1 knee up against your chest and hold for 20 to 30 seconds. Repeat this with the other knee. This may be done with the other leg straight or bent, whichever feels better. Then, pull both knees up against your chest. Sit-Ups or Curl-Ups.  Bend your knees 90 degrees. Start with tilting your pelvis, and do a partial, slow sit-up. Only lift your upper half 30 to 45 degrees off the floor. Take at least 2 to 3 seonds for each sit-up. Do not do sit-ups with your knees out straight. If partial sit-ups are difficult, simply do the above but with only tightening your belly (abdominal) muscles and holding it as told. Hip-Lift.  Lie on your back with your knees flexed 90 degrees. Push down with your feet and shoulders as you raise your hips 2 inches off the floor. Hold for 10 seconds, repeat 5 to 10 times. Back Arches.  Lie on your stomach. Prop yourself up on bent elbows. Slowly press on your hands, causing an arch in your low back. Repeat 3 to 5 times. Shoulder-Lifts.  Lie face down with arms beside your body. Keep hips and belly pressed to floor as you slowly lift your head and shoulders off the floor. Do not overdo your exercises. Be careful in the beginning. Exercises may cause you some mild back discomfort. If the pain lasts for more than 15 minutes, stop the exercises until you see your doctor. Improvement with exercise for back problems is slow.  Document  Released: 12/15/2010 Document Revised: 02/04/2012 Document Reviewed: 09/13/2011 [facility] Patient Information 2015 [place], [place]. This information is not intended to replace advice given to you by your health care provider. Make sure you discuss any questions you have with your health care provider.    Emergency Department Resource Guide 1) Find a Doctor and Pay Out of Pocket Although you won't have to find out who is covered by your insurance plan, it is a good idea to ask around and get recommendations. You will then need to call the office and see if the doctor you have chosen will accept you as a new patient and what types of options they offer for patients who are self-pay. Some doctors offer discounts or will set up payment plans for their patients who do not have insurance, but you will need to ask so you aren't surprised when you get to your appointment.  2) Contact Your Local Health Department Not all health departments have doctors that can see patients for sick visits, but many do, so it is worth a call  to see if yours does. If you don't know where your local health department is, you can check in your phone book. The CDC also has a tool to help you locate your state's health department, and many state websites also have listings of all of their local health departments.  3) Find a Walk-in Clinic If your illness is not likely to be very severe or complicated, you may want to try a walk in clinic. These are popping up all over the country in pharmacies, drugstores, and shopping centers. They're usually staffed by nurse practitioners or physician assistants that have been trained to treat common illnesses and complaints. They're usually fairly quick and inexpensive. However, if you have serious medical issues or chronic medical problems, these are probably not your best option.  No Primary Care Doctor: - Call Health Connect at  [phone] - they can help you locate a primary care doctor that   accepts your insurance, provides certain services, etc. - Physician Referral Service- [phone]  Chronic Pain Problems: Organization         Address  Phone   Notes  [facility]  [phone] Patients need to be referred by their primary care doctor.   Medication Assistance: Organization         Address  Phone   Notes  [facility] Medication [facility] [address] [place] [place], [place] [address] [phone] --Must be a resident of [facility] -- Must have NO insurance coverage whatsoever (no Medicaid/ Medicare, etc.) -- The pt. MUST have a primary care doctor that directs their care regularly and follows them in the community   [facility]  [phone]   [employer]  [phone]    Agencies that provide inexpensive medical care: Organization         Address  Phone   Notes  [facility]  [phone]   [facility]    [phone]   [facility] [address] [place], [place] [address] [phone]   [facility] [address] [place], [place] [phone]   [facility]    [phone]   [facility]    [phone]   [facility]  [address], [place] Phone:  [phone], Fax:  [phone] Hours of Operation:  9 am - 6 pm, M-F.  Also accepts Medicaid/Medicare and self-pay.  [facility]  [address], [place] Phone: [phone], Fax: [phone]. Hours of Operation:  8:30 am - 5:30 pm, M-F.  Also accepts Medicaid and self-pay.  [facility] [place] [address], [place] Phone: [phone]   [facility] [address] [place], [place] [phone], Ext. [phone] Mondays & Thursdays: 7-9 AM.  First 15 patients are seen on a first come, first serve basis.    Medicaid-accepting [facility] Providers:  Organization          Address  Phone   Notes  [facility] [address], [place] [phone] Also accepts self-pay patients.  [facility] [address] [place], [place]  [phone]   [facility] [address], [place] [phone]   [facility] [address] [place] [phone]   Renaye Rakers [address], [place]   [phone] Only accepts [place] Access Medicaid patients after they have their name applied to their card.   Self-Pay (no insurance) in [facility]:  Organization         Address  Phone   Notes  Sickle Cell Patients, [facility] [address] [place], [place] [phone]   [facility] [address] [place], [place] [phone]   [facility] [place]  [address], [address], [place] [phone]   [facility]/Dr. Osei-Bonsu  [address], [place] or [address], [place] [phone] Phone number for both [place] and [place] locations is the same.  [facility] [address] [place] [phone]   [facility] [address], [place] or [address] [phone]   [facility] [address] [place] [phone], phone; [phone], fax Sees patients 1st and 3rd Saturday of every month.  Must not qualify for public or private insurance (i.e. Medicaid, Medicare, [place] Health Choice, Veterans' Benefits)  Household income should be no more than 200% of the poverty level The clinic cannot treat you if you are pregnant or think you are pregnant  Sexually transmitted diseases are not treated at the clinic.

## 2014-07-29 NOTE — ED Provider Notes (Signed)
Medical screening examination/treatment/procedure(s) were performed by non-physician practitioner and as supervising physician I was immediately available for consultation/collaboration.    Jalen Daluz, MD 07/29/14 1511 
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
## 2014-08-01 NOTE — ED Provider Notes (Signed)
Medical screening examination/treatment/procedure(s) were performed by non-physician practitioner and as supervising physician I was immediately available for consultation/collaboration.  Sophi Calligan T Herman Fiero, MD 08/01/14 0930 

## 2015-06-09 ENCOUNTER — Encounter (HOSPITAL_COMMUNITY): Payer: Self-pay | Admitting: Emergency Medicine

## 2015-06-09 ENCOUNTER — Emergency Department (HOSPITAL_COMMUNITY)
Admission: EM | Admit: 2015-06-09 | Discharge: 2015-06-09 | Disposition: A | Discharge status: Home / Self Care | Payer: Medicaid - Out of State | Attending: Emergency Medicine | Admitting: Emergency Medicine

## 2015-06-09 DIAGNOSIS — E669 Obesity, unspecified: Secondary | ICD-10-CM | POA: Insufficient documentation

## 2015-06-09 DIAGNOSIS — J45909 Unspecified asthma, uncomplicated: Secondary | ICD-10-CM | POA: Insufficient documentation

## 2015-06-09 DIAGNOSIS — Z720 Tobacco use: Secondary | ICD-10-CM | POA: Insufficient documentation

## 2015-06-09 DIAGNOSIS — G8929 Other chronic pain: Secondary | ICD-10-CM | POA: Insufficient documentation

## 2015-06-09 DIAGNOSIS — Z8659 Personal history of other mental and behavioral disorders: Secondary | ICD-10-CM | POA: Insufficient documentation

## 2015-06-09 DIAGNOSIS — Z79899 Other long term (current) drug therapy: Secondary | ICD-10-CM | POA: Insufficient documentation

## 2015-06-09 DIAGNOSIS — Z791 Long term (current) use of non-steroidal anti-inflammatories (NSAID): Secondary | ICD-10-CM | POA: Insufficient documentation

## 2015-06-09 DIAGNOSIS — M25561 Pain in right knee: Secondary | ICD-10-CM | POA: Insufficient documentation

## 2015-06-09 MED ORDER — MELOXICAM 7.5 MG PO TABS: 7.5000 mg | ORAL_TABLET | Freq: Every day | ORAL | Status: DC

## 2015-06-09 MED ORDER — MELOXICAM 7.5 MG PO TABS
7.5000 mg | ORAL_TABLET | Freq: Once | ORAL | Status: AC
  Administered 2015-06-09: 7.5 mg via ORAL
  Filled 2015-06-09: qty 1

## 2015-06-09 NOTE — ED Provider Notes (Signed)
CSN: [id number]     Arrival date & time 06/09/15  1652 History   This chart was scribed for Amanda Strauss, PA-C working with No att. providers found by Elveria Rising, ED Scribe. This patient was seen in room WTR7/WTR7 and the patient's care was started at 5:15 PM.   Chief Complaint  Patient presents with  . Knee Pain    R knee, x1 mo, no injury   Patient is a 44 y.o. female presenting with knee pain. The history is provided by the patient. No language interpreter was used.  Knee Pain Location:  Knee Time since incident:  2 months Injury: no   Knee location:  R knee Pain details:    Quality:  Cramping and aching   Radiates to:  R leg   Severity:  Moderate   Onset quality:  Gradual   Duration:  2 months   Timing:  Constant   Progression:  Worsening Chronicity:  Chronic Dislocation: no   Prior injury to area:  No Relieved by:  NSAIDs and arthritis medications Worsened by:  Activity Ineffective treatments:  None tried Associated symptoms: swelling   Associated symptoms: no decreased ROM, no fever, no muscle weakness, no numbness and no tingling   Risk factors: obesity    HPI Comments: Amanda Beck is a 44 y.o. female who presents to the Emergency Department complaining of right knee pain and swelling for two months now that has recently worsened. Patient describes 8/10, constant, stabbing/aching pain to anterior knee and cramping pain to medial and lateral aspects that radiates into her thigh. Patient reports that her pain is exacerbated especially with bending and getting up from seated positions. Patient reports attempted treatment with ibuprofen, icy hot, and bandaging, which does not provide complete relief. Patient denies direct injury or trauma. Patient shares history of left knee surgery performed in [place] and states that she favors the right leg and thinks maybe this is why she's having more pain. Patient denies fever, chills, shortness of breath, LE swelling,  chest pain, abdominal pain, nausea, vomiting, diarrhea, numbness, tingling, weakness, redness or warmth to joint.   Past Medical History  Diagnosis Date  . Asthma   . Bipolar 1 disorder   . Back pain    Past Surgical History  Procedure Laterality Date  . Cesarean section    . Abdominal hysterectomy    . Knee surgery      left   No family history on file. History  Substance Use Topics  . Smoking status: Current Every Day Smoker -- 0.25 packs/day for 5 years    Types: Cigarettes  . Smokeless tobacco: Never Used  . Alcohol Use: Yes     Comment: occasionally   OB History    No data available     Review of Systems  Constitutional: Negative for fever and chills.  Respiratory: Negative for shortness of breath.   Cardiovascular: Negative for chest pain.  Gastrointestinal: Negative for nausea, vomiting, abdominal pain and diarrhea.  Genitourinary: Negative for dysuria and hematuria.  Musculoskeletal: Positive for joint swelling and arthralgias.  Skin: Negative for color change.  Allergic/Immunologic: Negative for immunocompromised state.  Neurological: Negative for weakness and numbness.  A complete 10 system review of systems was obtained and all systems are negative except as noted in the HPI and PMH.    Allergies  Bee venom  Home Medications   Prior to Admission medications   Medication Sig Start Date End Date Taking? Authorizing Provider  albuterol (PROVENTIL HFA;VENTOLIN HFA)  108 (90 BASE) MCG/ACT inhaler Inhale 2 puffs into the lungs every 6 (six) hours as needed for wheezing or shortness of breath.     Historical Provider, MD  cyclobenzaprine (FLEXERIL) 10 MG tablet Take 1 tablet (10 mg total) by mouth at bedtime. 07/25/14   Mellody DrownLauren Parker, PA-C  cyclobenzaprine (FLEXERIL) 10 MG tablet Take 1 tablet (10 mg total) by mouth 3 (three) times daily as needed for muscle spasms. 07/29/14   Dawood Spitler Camprubi-Soms, PA-C  cyclobenzaprine (FLEXERIL) 5 MG tablet Take 1 tablet (5 mg  total) by mouth 3 (three) times daily as needed (chest wall pain). 05/12/14   Devoria AlbeIva Knapp, MD  HYDROcodone-acetaminophen (NORCO) 5-325 MG per tablet Take 1-2 tablets by mouth every 6 (six) hours as needed for severe pain. 07/29/14   Cadyn Fann Camprubi-Soms, PA-C  HYDROcodone-acetaminophen (NORCO/VICODIN) 5-325 MG per tablet Take 1 tablet by mouth every 4 (four) hours as needed for moderate pain or severe pain. 07/25/14   Mellody DrownLauren Parker, PA-C  ibuprofen (ADVIL,MOTRIN) 200 MG tablet Take 600-800 mg by mouth every 6 (six) hours as needed for moderate pain.     Historical Provider, MD  naproxen (NAPROSYN) 500 MG tablet Take 1 tablet (500 mg total) by mouth 2 (two) times daily with a meal. 07/25/14   Mellody DrownLauren Parker, PA-C  naproxen (NAPROSYN) 500 MG tablet Take 1 tablet (500 mg total) by mouth 2 (two) times daily as needed for mild pain, moderate pain or headache (TAKE WITH MEALS.). 07/29/14   Regnald Bowens Camprubi-Soms, PA-C   Triage Vitals: BP 152/78 mmHg  Pulse 71  Temp(Src) 98.5 F (36.9 C) (Oral)  Resp 18  Ht 5\' 9"  (1.753 m)  Wt 290 lb (131.543 kg)  BMI 42.81 kg/m2  SpO2 99% Physical Exam  Constitutional: She is oriented to person, place, and time. Vital signs are normal. She appears well-developed and well-nourished.  Non-toxic appearance. No distress.  Afebrile, nontoxic, NAD  HENT:  Head: Normocephalic and atraumatic.  Mouth/Throat: Mucous membranes are normal.  Eyes: Conjunctivae and EOM are normal. Right eye exhibits no discharge. Left eye exhibits no discharge.  Neck: Normal range of motion. Neck supple.  Cardiovascular: Normal rate and intact distal pulses.   Pulmonary/Chest: Effort normal. No respiratory distress.  Abdominal: Normal appearance. She exhibits no distension.  Musculoskeletal: Normal range of motion.       Right knee: She exhibits normal range of motion, no swelling, no effusion, no deformity, no erythema, normal alignment, no LCL laxity, normal patellar mobility, no bony tenderness  and no MCL laxity. Tenderness found. Lateral joint line tenderness noted.       Legs: R knee with FROM intact, no bony TTP, mild lateral joint line TTP, no swelling/effusion/deformity, no bruising or erythema, no warmth, no abnormal alignment or patellar mobility, no varus/valgus laxity, neg anterior drawer test, no crepitus.  Strength and sensation grossly intact, distal pulses intact  Neurological: She is alert and oriented to person, place, and time. She has normal strength. No sensory deficit.  Skin: Skin is warm, dry and intact. No rash noted.  Psychiatric: She has a normal mood and affect. Her behavior is normal.  Nursing note and vitals reviewed.   ED Course  Procedures (including critical care time)  COORDINATION OF CARE: 5:24 PM- Discussed treatment plan with patient at bedside and patient agreed to plan.   Labs Review Labs Reviewed - No data to display  Imaging Review No results found.   EKG Interpretation None      MDM   Final  diagnoses:  Knee pain, chronic, right  Obesity    44 y.o. female here with R knee pain x2 months, no injury. FROM intact, soft compartments, no swelling or warmth/erythema. Neurovascularly intact. Likely degenerative changes, doubt need for xray imaging. Discussed weight loss and mobic use daily. Discussed ice/heat. Will apply knee sleeve but doubt need for crutches. Will give ortho referral for ongoing management. I explained the diagnosis and have given explicit precautions to return to the ER including for any other new or worsening symptoms. The patient understands and accepts the medical plan as it's been dictated and I have answered their questions. Discharge instructions concerning home care and prescriptions have been given. The patient is STABLE and is discharged to home in good condition.   I personally performed the services described in this documentation, which was scribed in my presence. The recorded information has been reviewed  and is accurate.  BP 152/78 mmHg  Pulse 71  Temp(Src) 98.5 F (36.9 C) (Oral)  Resp 18  Ht  (1.753 m)  Wt 290 lb (131.543 kg)  BMI 42.81 kg/m2  SpO2 99%  Meds ordered this encounter  Medications  . meloxicam (MOBIC) tablet 7.5 mg    Sig:   . meloxicam (MOBIC) 7.5 MG tablet    Sig: Take 1 tablet (7.5 mg total) by mouth daily.    Dispense:  30 tablet    Refill:  0    Order Specific Question:  Supervising Provider    Answer:  Eber Hong [[id number]]      Rachana Malesky Camprubi-Soms, PA-C 06/09/15 1731  Eber Hong, MD 06/09/15 2348

## 2015-06-09 NOTE — ED Notes (Signed)
Pt left prior to dc instructions.

## 2015-06-09 NOTE — ED Notes (Signed)
Pt A+Ox4, reports c/o R knee pain x1 mo.  Pt denies injury.  Reports worse with movement, standing/walking.  Ambulatory with steady gait.  MAEI.  Skin PWD.  Speaking full/clear sentences, rr even/un-lab.  NAD.

## 2015-06-09 NOTE — Discharge Instructions (Signed)
Wear knee sleeve as needed for comfort. Alternate between ice and heat to your knee throughout the day. Use mobic daily. Use tylenol as needed for breakthrough pain. Try to lose weight which will help your joint pain. Call orthopedic follow up today or tomorrow to schedule followup appointment for recheck of ongoing knee pain in one to two weeks that can be canceled with a 24-48 hour notice if complete resolution of pain. Return to the ER for changes or worsening symptoms.   Knee Pain Knee pain can be a result of an injury or other medical conditions. Treatment will depend on the cause of your pain. HOME CARE  Only take medicine as told by your doctor.  Keep a healthy weight. Being overweight can make the knee hurt more.  Stretch before exercising or playing sports.  If there is constant knee pain, change the way you exercise. Ask your doctor for advice.  Make sure shoes fit well. Choose the right shoe for the sport or activity.  Protect your knees. Wear kneepads if needed.  Rest when you are tired. GET HELP RIGHT AWAY IF:   Your knee pain does not stop.  Your knee pain does not get better.  Your knee joint feels hot to the touch.  You have a fever. MAKE SURE YOU:   Understand these instructions.  Will watch this condition.  Will get help right away if you are not doing well or get worse. Document Released: 02/08/2009 Document Revised: 02/04/2012 Document Reviewed: 02/08/2009 [facility] Patient Information 2015 [place]. This information is not intended to replace advice given to you by your health care provider. Make sure you discuss any questions you have with your health care provider.  Heat Therapy Heat therapy can help make painful, stiff muscles and joints feel better. Do not use heat on new injuries. Wait at least 48 hours after an injury to use heat. Do not use heat when you have aches or pains right after an activity. If you still have pain 3 hours after stopping  the activity, then you may use heat. HOME CARE Wet heat pack  Soak a clean towel in warm water. Squeeze out the extra water.  Put the warm, wet towel in a plastic bag.  Place a thin, dry towel between your skin and the bag.  Put the heat pack on the area for 5 minutes, and check your skin. Your skin may be pink, but it should not be red.  Leave the heat pack on the area for 15 to 30 minutes.  Repeat this every 2 to 4 hours while awake. Do not use heat while you are sleeping. Warm water bath  Fill a tub with warm water.  Place the affected body part in the tub.  Soak the area for 20 to 40 minutes.  Repeat as needed. Hot water bottle  Fill the water bottle half full with hot water.  Press out the extra air. Close the cap tightly.  Place a dry towel between your skin and the bottle.  Put the bottle on the area for 5 minutes, and check your skin. Your skin may be pink, but it should not be red.  Leave the bottle on the area for 15 to 30 minutes.  Repeat this every 2 to 4 hours while awake. Electric heating pad  Place a dry towel between your skin and the heating pad.  Set the heating pad on low heat.  Put the heating pad on the area for 10  minutes, and check your skin. Your skin may be pink, but it should not be red.  Leave the heating pad on the area for 20 to 40 minutes.  Repeat this every 2 to 4 hours while awake.  Do not lie on the heating pad.  Do not fall asleep while using the heating pad.  Do not use the heating pad near water. GET HELP RIGHT AWAY IF:  You get blisters or red skin.  Your skin is puffy (swollen), or you lose feeling (numbness) in the affected area.  You have any new problems.  Your problems are getting worse.  You have any questions or concerns. If you have any problems, stop using heat therapy until you see your doctor. MAKE SURE YOU:  Understand these instructions.  Will watch your condition.  Will get help right away if  you are not doing well or get worse. Document Released: 02/04/2012 Document Reviewed: 01/05/2014 [facility] Patient Information 2015 [place], [place]. This information is not intended to replace advice given to you by your health care provider. Make sure you discuss any questions you have with your health care provider.  Cryotherapy Cryotherapy is when you put ice on your injury. Ice helps lessen pain and puffiness (swelling) after an injury. Ice works the best when you start using it in the first 24 to 48 hours after an injury. HOME CARE  Put a dry or damp towel between the ice pack and your skin.  You may press gently on the ice pack.  Leave the ice on for no more than 10 to 20 minutes at a time.  Check your skin after 5 minutes to make sure your skin is okay.  Rest at least 20 minutes between ice pack uses.  Stop using ice when your skin loses feeling (numbness).  Do not use ice on someone who cannot tell you when it hurts. This includes small children and people with memory problems (dementia). GET HELP RIGHT AWAY IF:  You have white spots on your skin.  Your skin turns blue or pale.  Your skin feels waxy or hard.  Your puffiness gets worse. MAKE SURE YOU:   Understand these instructions.  Will watch your condition.  Will get help right away if you are not doing well or get worse. Document Released: 04/30/2008 Document Revised: 02/04/2012 Document Reviewed: 07/05/2011 [id number] Patient Information 2015 [place], [place]. This information is not intended to replace advice given to you by your health care provider. Make sure you discuss any questions you have with your health care provider.

## 2015-06-13 ENCOUNTER — Encounter (HOSPITAL_COMMUNITY): Payer: Self-pay

## 2015-06-13 ENCOUNTER — Emergency Department (HOSPITAL_COMMUNITY)
Admission: EM | Admit: 2015-06-13 | Discharge: 2015-06-14 | Disposition: A | Discharge status: Home / Self Care | Payer: Medicaid - Out of State | Attending: Emergency Medicine | Admitting: Emergency Medicine

## 2015-06-13 ENCOUNTER — Emergency Department (HOSPITAL_COMMUNITY): Payer: Medicaid - Out of State

## 2015-06-13 DIAGNOSIS — Z79899 Other long term (current) drug therapy: Secondary | ICD-10-CM | POA: Insufficient documentation

## 2015-06-13 DIAGNOSIS — F319 Bipolar disorder, unspecified: Secondary | ICD-10-CM | POA: Insufficient documentation

## 2015-06-13 DIAGNOSIS — M1711 Unilateral primary osteoarthritis, right knee: Secondary | ICD-10-CM | POA: Insufficient documentation

## 2015-06-13 DIAGNOSIS — J45909 Unspecified asthma, uncomplicated: Secondary | ICD-10-CM | POA: Insufficient documentation

## 2015-06-13 DIAGNOSIS — M25569 Pain in unspecified knee: Secondary | ICD-10-CM

## 2015-06-13 DIAGNOSIS — Z720 Tobacco use: Secondary | ICD-10-CM | POA: Insufficient documentation

## 2015-06-13 MED ORDER — OXYCODONE-ACETAMINOPHEN 5-325 MG PO TABS
1.0000 | ORAL_TABLET | Freq: Once | ORAL | Status: AC
  Administered 2015-06-13: 1 via ORAL
  Filled 2015-06-13: qty 1

## 2015-06-13 NOTE — ED Notes (Signed)
Pt complains of right leg pain for two weeks, she continues to complain of the same pain that is getting worse

## 2015-06-13 NOTE — ED Provider Notes (Signed)
CSN: [id number]     Arrival date & time 06/13/15  2043 History   First MD Initiated Contact with Patient 06/13/15 2107     Chief Complaint  Patient presents with  . Leg Pain     (Consider location/radiation/quality/duration/timing/severity/associated sxs/prior Treatment) HPI Patient presents to the emergency department with right knee pain has been ongoing for 2 months.  The patient states that she sat down on a bench that was lower than she fell she twisted her right knee.  She states that the pain has been fairly constant since that time.  She states that this got worse over the last 2 weeks.  Patient states that nothing seems make the condition better, movement and palpation make the pain worse.  A denies numbness, weakness, dizziness, back pain Past Medical History  Diagnosis Date  . Asthma   . Bipolar 1 disorder   . Back pain    Past Surgical History  Procedure Laterality Date  . Cesarean section    . Abdominal hysterectomy    . Knee surgery      left   History reviewed. No pertinent family history. History  Substance Use Topics  . Smoking status: Current Every Day Smoker -- 0.25 packs/day for 5 years    Types: Cigarettes  . Smokeless tobacco: Never Used  . Alcohol Use: Yes     Comment: occasionally   OB History    No data available     Review of Systems All other systems negative except as documented in the HPI. All pertinent positives and negatives as reviewed in the HPI.    Allergies  Bee venom  Home Medications   Prior to Admission medications   Medication Sig Start Date End Date Taking? Authorizing Provider  albuterol (PROVENTIL HFA;VENTOLIN HFA) 108 (90 BASE) MCG/ACT inhaler Inhale 2 puffs into the lungs every 6 (six) hours as needed for wheezing or shortness of breath.    Yes Historical Provider, MD  ibuprofen (ADVIL,MOTRIN) 200 MG tablet Take 600-800 mg by mouth every 6 (six) hours as needed for moderate pain.    Yes Historical Provider, MD  meloxicam  (MOBIC) 7.5 MG tablet Take 1 tablet (7.5 mg total) by mouth daily. Patient not taking: Reported on 06/13/2015 06/09/15   Mercedes Camprubi-Soms, PA-C   BP 131/62 mmHg  Pulse 65  Temp(Src) 98.2 F (36.8 C) (Oral)  Resp 16  SpO2 99% Physical Exam  Constitutional: She is oriented to person, place, and time. She appears well-developed and well-nourished. No distress.  HENT:  Head: Normocephalic and atraumatic.  Pulmonary/Chest: Effort normal.  Musculoskeletal:       Right knee: She exhibits normal range of motion, no swelling, no effusion, no ecchymosis, no deformity, normal alignment, no bony tenderness and normal meniscus. Tenderness found. Medial joint line, lateral joint line and LCL tenderness noted.  Neurological: She is alert and oriented to person, place, and time.  Skin: Skin is warm and dry. No rash noted. No erythema.  Nursing note and vitals reviewed.   ED Course  Procedures (including critical care time) Labs Review Labs Reviewed - No data to display  Imaging Review Dg Knee Complete 4 Views Right  06/13/2015   CLINICAL DATA:  44 year old female with knee pain  EXAM: RIGHT KNEE - COMPLETE 4+ VIEW  COMPARISON:  None.  FINDINGS: No acute fracture or dislocation. There is narrowing of the medial compartment with juxta-articular bone spurring. A small suprapatellar effusion may be present. The soft tissues are unremarkable.  IMPRESSION: Degenerative  changes with narrowing of the medial compartment. No acute fracture or dislocation.   Electronically Signed   By: Elgie Collard M.D.   On: 06/13/2015 23:12     The patient has degenerative changes of the knee.  Patient be referred to orthopedics, placed in a knee immobilizer and crutches.  Told to return here as needed.  Ice and elevate the knee  Charlestine Night, PA-C 06/14/15 0005  Eber Hong, MD 06/14/15 [phone]

## 2015-06-14 MED ORDER — PREDNISONE 50 MG PO TABS: 50.0000 mg | ORAL_TABLET | Freq: Every day | ORAL | Status: DC

## 2015-06-14 MED ORDER — HYDROCODONE-ACETAMINOPHEN 5-325 MG PO TABS: 1.0000 | ORAL_TABLET | Freq: Four times a day (QID) | ORAL | Status: DC | PRN

## 2015-06-14 NOTE — Discharge Instructions (Signed)
Follow-up with the orthopedist provided.  The x-ray showed that she had degenerative changes in the knee, ice and elevate the knee

## 2015-11-17 ENCOUNTER — Encounter (HOSPITAL_COMMUNITY): Payer: Self-pay | Admitting: Emergency Medicine

## 2015-11-17 ENCOUNTER — Emergency Department (HOSPITAL_COMMUNITY)
Admission: EM | Admit: 2015-11-17 | Discharge: 2015-11-17 | Disposition: A | Discharge status: Home / Self Care | Payer: Medicaid - Out of State | Attending: Emergency Medicine | Admitting: Emergency Medicine

## 2015-11-17 DIAGNOSIS — Y9289 Other specified places as the place of occurrence of the external cause: Secondary | ICD-10-CM | POA: Insufficient documentation

## 2015-11-17 DIAGNOSIS — Y9389 Activity, other specified: Secondary | ICD-10-CM | POA: Insufficient documentation

## 2015-11-17 DIAGNOSIS — J45909 Unspecified asthma, uncomplicated: Secondary | ICD-10-CM | POA: Insufficient documentation

## 2015-11-17 DIAGNOSIS — Y990 Civilian activity done for income or pay: Secondary | ICD-10-CM | POA: Insufficient documentation

## 2015-11-17 DIAGNOSIS — W260XXA Contact with knife, initial encounter: Secondary | ICD-10-CM | POA: Insufficient documentation

## 2015-11-17 DIAGNOSIS — Z23 Encounter for immunization: Secondary | ICD-10-CM | POA: Insufficient documentation

## 2015-11-17 DIAGNOSIS — Z8659 Personal history of other mental and behavioral disorders: Secondary | ICD-10-CM | POA: Insufficient documentation

## 2015-11-17 DIAGNOSIS — F17210 Nicotine dependence, cigarettes, uncomplicated: Secondary | ICD-10-CM | POA: Insufficient documentation

## 2015-11-17 DIAGNOSIS — S61412A Laceration without foreign body of left hand, initial encounter: Secondary | ICD-10-CM

## 2015-11-17 DIAGNOSIS — Z79899 Other long term (current) drug therapy: Secondary | ICD-10-CM | POA: Insufficient documentation

## 2015-11-17 MED ORDER — LIDOCAINE-EPINEPHRINE (PF) 2 %-1:200000 IJ SOLN
INTRAMUSCULAR | Status: AC
  Filled 2015-11-17: qty 20

## 2015-11-17 MED ORDER — LORAZEPAM 1 MG PO TABS
1.0000 mg | ORAL_TABLET | Freq: Once | ORAL | Status: AC
  Administered 2015-11-17: 1 mg via ORAL
  Filled 2015-11-17: qty 1

## 2015-11-17 MED ORDER — LIDOCAINE-EPINEPHRINE (PF) 2 %-1:200000 IJ SOLN
10.0000 mL | Freq: Once | INTRAMUSCULAR | Status: AC
  Administered 2015-11-17: 10 mL

## 2015-11-17 MED ORDER — FLUCONAZOLE 150 MG PO TABS: 150.0000 mg | ORAL_TABLET | Freq: Once | ORAL | Status: DC

## 2015-11-17 MED ORDER — CEPHALEXIN 500 MG PO CAPS: 500.0000 mg | ORAL_CAPSULE | Freq: Four times a day (QID) | ORAL | Status: AC

## 2015-11-17 MED ORDER — TETANUS-DIPHTH-ACELL PERTUSSIS 5-2.5-18.5 LF-MCG/0.5 IM SUSP
0.5000 mL | Freq: Once | INTRAMUSCULAR | Status: AC
  Administered 2015-11-17: 0.5 mL via INTRAMUSCULAR
  Filled 2015-11-17: qty 0.5

## 2015-11-17 NOTE — Discharge Instructions (Signed)

## 2015-11-17 NOTE — ED Provider Notes (Signed)
CSN: [id number]     Arrival date & time 11/17/15  1115 History   First MD Initiated Contact with Patient 11/17/15 1122     Chief Complaint  Patient presents with  . Laceration     (Consider location/radiation/quality/duration/timing/severity/associated sxs/prior Treatment) The history is provided by the patient and medical records. No language interpreter was used.   Amanda Beck is a 44 y.o. female  with a hx of asthma presents to the Emergency Department complaining of acute, persistent laceration to the left hand onto proximally 30 minutes prior to arrival. Patient reports she was opening a box at work with a pocket knife when it slipped and cut her right hand between her thumb and pointer finger. Patient reports copious and brisk bleeding on scene but no arterial squirting.  Patient without history of HIV, diabetes or immunosuppression. Denies numbness, tingling, weakness in the left hand.  Pressure dressing applied with hemostasis. Nothing makes the symptoms worse. Patient denies fever, chills, nausea or vomiting.   Past Medical History  Diagnosis Date  . Asthma   . Bipolar 1 disorder (HCC)   . Back pain    Past Surgical History  Procedure Laterality Date  . Cesarean section    . Abdominal hysterectomy    . Knee surgery      left   No family history on file. Social History  Substance Use Topics  . Smoking status: Current Every Day Smoker -- 0.25 packs/day for 5 years    Types: Cigarettes  . Smokeless tobacco: Never Used  . Alcohol Use: Yes     Comment: occasionally   OB History    No data available     Review of Systems  Constitutional: Negative for fever.  Gastrointestinal: Negative for nausea and vomiting.  Skin: Positive for wound.  Allergic/Immunologic: Negative for immunocompromised state.  Neurological: Negative for weakness and numbness.  Hematological: Does not bruise/bleed easily.  Psychiatric/Behavioral: The patient is not nervous/anxious.        Allergies  Bee venom  Home Medications   Prior to Admission medications   Medication Sig Start Date End Date Taking? Authorizing Provider  albuterol (PROVENTIL HFA;VENTOLIN HFA) 108 (90 BASE) MCG/ACT inhaler Inhale 2 puffs into the lungs every 6 (six) hours as needed for wheezing or shortness of breath.    Yes Historical Provider, MD  Guaifenesin 1200 MG TB12 Take 2,400 mg by mouth daily as needed (cold symptoms).   Yes Historical Provider, MD  ibuprofen (ADVIL,MOTRIN) 200 MG tablet Take 600-800 mg by mouth every 6 (six) hours as needed for moderate pain.    Yes Historical Provider, MD  loperamide (IMODIUM A-D) 2 MG tablet Take 2 mg by mouth 4 (four) times daily as needed for diarrhea or loose stools.   Yes Historical Provider, MD  OVER THE COUNTER MEDICATION Take 2 tablets by mouth every 6 (six) hours as needed (cold symptoms). [employer] contact cold   Yes Historical Provider, MD  cephALEXin (KEFLEX) 500 MG capsule Take 1 capsule (500 mg total) by mouth 4 (four) times daily. 11/17/15   Akaylah Lalley, PA-C   BP 160/80 mmHg  Pulse 60  Temp(Src) 97.9 F (36.6 C) (Oral)  Resp 18  SpO2 99% Physical Exam  Constitutional: She is oriented to person, place, and time. She appears well-developed and well-nourished. No distress.  HENT:  Head: Normocephalic and atraumatic.  Eyes: Conjunctivae are normal. No scleral icterus.  Neck: Normal range of motion.  Cardiovascular: Normal rate, regular rhythm, normal heart sounds and  intact distal pulses.   No murmur heard. Capillary refill < 3 sec  Pulmonary/Chest: Effort normal and breath sounds normal. No respiratory distress.  Musculoskeletal: Normal range of motion. She exhibits no edema.  ROM: Full range of motion of all fingers of the left hand and left wrist 2 cm laceration between the thumb and pointer finger on the left hand; brisk capillary bleeding and small arteriole bleeding noted  Neurological: She is alert and oriented to  person, place, and time.  Sensation: intact to dull and sharp Strength: 5/5 strength in the left upper extremity  Skin: Skin is warm and dry. She is not diaphoretic.  Psychiatric: She has a normal mood and affect.  Nursing note and vitals reviewed.   ED Course  .Marland KitchenLaceration Repair Date/Time: 11/17/2015 1:34 PM Performed by: Dierdre Forth Authorized by: Dierdre Forth Consent: Verbal consent obtained. Risks and benefits: risks, benefits and alternatives were discussed Consent given by: patient Patient understanding: patient states understanding of the procedure being performed Patient consent: the patient's understanding of the procedure matches consent given Procedure consent: procedure consent matches procedure scheduled Relevant documents: relevant documents present and verified Site marked: the operative site was marked Required items: required blood products, implants, devices, and special equipment available Patient identity confirmed: verbally with patient and arm band Time out: Immediately prior to procedure a "time out" was called to verify the correct patient, procedure, equipment, support staff and site/side marked as required. Body area: upper extremity Location details: left hand Laceration length: 2 cm Foreign bodies: no foreign bodies Tendon involvement: none Nerve involvement: none Anesthesia: local infiltration Local anesthetic: lidocaine 2% with epinephrine Anesthetic total: 4 ml Patient sedated: no Preparation: Patient was prepped and draped in the usual sterile fashion. Irrigation solution: saline Irrigation method: syringe Amount of cleaning: extensive Skin closure: 4-0 Prolene Number of sutures: 2 Technique: horizontal mattress Approximation: close Approximation difficulty: complex Dressing: pressure dressing Patient tolerance: Patient tolerated the procedure well with no immediate complications   (including critical care time) Labs  Review Labs Reviewed - No data to display  Imaging Review No results found. I have personally reviewed and evaluated these images and lab results as part of my medical decision-making.   EKG Interpretation None      MDM   Final diagnoses:  Laceration of left hand, initial encounter    Amanda Beck presents with laceration to the left hand.  Pressure irrigation performed. Wound explored and base of wound visualized in a bloodless field without evidence of foreign body.  Laceration occurred < 8 hours prior to repair which was well tolerated. Tdap updated.  Pt has no comorbidities to effect normal wound healing. Pt discharged with antibiotics.  Discussed suture home care with patient and answered questions. Pt to follow-up for wound check and suture removal in 7 days; they are to return to the ED sooner for signs of infection. Pt is hemodynamically stable with no complaints prior to dc.   The patient was discussed with and seen by Dr. Jeraldine Loots who agrees with the treatment plan.    Amanda Client Makaylie Dedeaux, PA-C 11/17/15 1353  Gerhard Munch, MD 11/17/15 [phone]

## 2015-11-17 NOTE — ED Notes (Signed)
Pt was at working opening a box when knife cut her left hand.  Currently bleeding at this time.

## 2015-12-02 ENCOUNTER — Encounter (HOSPITAL_COMMUNITY): Payer: Self-pay | Admitting: Emergency Medicine

## 2015-12-02 ENCOUNTER — Emergency Department (HOSPITAL_COMMUNITY)
Admission: EM | Admit: 2015-12-02 | Discharge: 2015-12-02 | Disposition: A | Discharge status: Home / Self Care | Payer: Medicaid - Out of State | Attending: Emergency Medicine | Admitting: Emergency Medicine

## 2015-12-02 DIAGNOSIS — Z8659 Personal history of other mental and behavioral disorders: Secondary | ICD-10-CM | POA: Insufficient documentation

## 2015-12-02 DIAGNOSIS — J45909 Unspecified asthma, uncomplicated: Secondary | ICD-10-CM | POA: Insufficient documentation

## 2015-12-02 DIAGNOSIS — Z792 Long term (current) use of antibiotics: Secondary | ICD-10-CM | POA: Insufficient documentation

## 2015-12-02 DIAGNOSIS — Z79899 Other long term (current) drug therapy: Secondary | ICD-10-CM | POA: Insufficient documentation

## 2015-12-02 DIAGNOSIS — Z4802 Encounter for removal of sutures: Secondary | ICD-10-CM

## 2015-12-02 DIAGNOSIS — F17210 Nicotine dependence, cigarettes, uncomplicated: Secondary | ICD-10-CM | POA: Insufficient documentation

## 2015-12-02 MED ORDER — PREDNISONE 10 MG PO TABS: 10.0000 mg | ORAL_TABLET | Freq: Two times a day (BID) | ORAL | Status: AC

## 2015-12-02 NOTE — ED Provider Notes (Signed)
CSN: [id number]     Arrival date & time 12/02/15  1552 History  By signing my name below, I, Tanda Rockers, attest that this documentation has been prepared under the direction and in the presence of [employer], PA-C. Electronically Signed: Tanda Rockers, ED Scribe. 12/02/2015. 4:34 PM.   Chief Complaint  Patient presents with  . Suture / Staple Removal   The history is provided by the patient. No language interpreter was used.     HPI Comments: Amanda Beck is a 45 y.o. female who presents to the Emergency Department for suture removal. Pt was seen in the ED on 11/17/2015 for laceration to her left hand. She had 2 sutures placed and was told to return in 7 days for removal. Pt admits to waiting longer than she should have to get them removed. Denies redness, swelling, or discharge to the area.   Past Medical History  Diagnosis Date  . Asthma   . Bipolar 1 disorder (HCC)   . Back pain    Past Surgical History  Procedure Laterality Date  . Cesarean section    . Abdominal hysterectomy    . Knee surgery      left   No family history on file. Social History  Substance Use Topics  . Smoking status: Current Every Day Smoker -- 0.25 packs/day for 5 years    Types: Cigarettes  . Smokeless tobacco: Never Used  . Alcohol Use: Yes     Comment: occasionally   OB History    No data available     Review of Systems  Musculoskeletal: Negative for joint swelling.  Skin: Negative for color change.  All other systems reviewed and are negative.  Allergies  Bee venom  Home Medications   Prior to Admission medications   Medication Sig Start Date End Date Taking? Authorizing Provider  albuterol (PROVENTIL HFA;VENTOLIN HFA) 108 (90 BASE) MCG/ACT inhaler Inhale 2 puffs into the lungs every 6 (six) hours as needed for wheezing or shortness of breath.     Historical Provider, MD  cephALEXin (KEFLEX) 500 MG capsule Take 1 capsule (500 mg total) by mouth 4 (four) times daily.  11/17/15   Hannah Muthersbaugh, PA-C  fluconazole (DIFLUCAN) 150 MG tablet Take 1 tablet (150 mg total) by mouth once. 11/17/15   Hannah Muthersbaugh, PA-C  Guaifenesin 1200 MG TB12 Take 2,400 mg by mouth daily as needed (cold symptoms).    Historical Provider, MD  ibuprofen (ADVIL,MOTRIN) 200 MG tablet Take 600-800 mg by mouth every 6 (six) hours as needed for moderate pain.     Historical Provider, MD  loperamide (IMODIUM A-D) 2 MG tablet Take 2 mg by mouth 4 (four) times daily as needed for diarrhea or loose stools.    Historical Provider, MD  OVER THE COUNTER MEDICATION Take 2 tablets by mouth every 6 (six) hours as needed (cold symptoms). [employer] contact cold    Historical Provider, MD   Triage Vitals:  There were no vitals taken for this visit.   Physical Exam  Constitutional: She is oriented to person, place, and time. She appears well-developed and well-nourished. No distress.  HENT:  Head: Normocephalic and atraumatic.  Eyes: Conjunctivae are normal. Right eye exhibits no discharge. Left eye exhibits no discharge. No scleral icterus.  Cardiovascular: Normal rate.   Pulmonary/Chest: Effort normal.  Neurological: She is alert and oriented to person, place, and time. Coordination normal.  Skin: Skin is warm and dry. No rash noted. She is not diaphoretic. No erythema.  No pallor.  1 cm well healed laceration in the webbing between the thumb and first finger on the left hand; 2 sutures in place; no surrounding cellulitis  Psychiatric: She has a normal mood and affect. Her behavior is normal.  Nursing note and vitals reviewed.   ED Course  Procedures (including critical care time)  DIAGNOSTIC STUDIES: Oxygen Saturation is 100% on RA, normal by my interpretation.    COORDINATION OF CARE: 4:32 PM-Discussed treatment plan which includes suture removal with pt at bedside and pt agreed to plan.   Labs Review Labs Reviewed - No data to display  Imaging Review No results  found.   EKG Interpretation None      MDM   Final diagnoses:  Visit for suture removal   Patient presents for suture removal. The wound is well healed without signs of infection.  The sutures are removed. Wound care and activity instructions given. Return prn.   I personally performed the services described in this documentation, which was scribed in my presence. The recorded information has been reviewed and is accurate.       Lester KinsmanSamantha Tripp North Week[place] WacheeDow[place], PA-C 12/03/15 2217  Tilden FossaElizabeth Rees, MD 12/04/15 1425

## 2015-12-02 NOTE — ED Notes (Signed)
Pt states she is here for suture removal from lt hand.  No issues.  Sutures were placed a week and a half ago.

## 2015-12-02 NOTE — Discharge Instructions (Signed)
Suture Removal, Care After Refer to this sheet in the next few weeks. These instructions provide you with information on caring for yourself after your procedure. Your health care provider may also give you more specific instructions. Your treatment has been planned according to current medical practices, but problems sometimes occur. Call your health care provider if you have any problems or questions after your procedure. WHAT TO EXPECT AFTER THE PROCEDURE After your stitches (sutures) are removed, it is typical to have the following:  Some discomfort and swelling in the wound area.  Slight redness in the area. HOME CARE INSTRUCTIONS   If you have skin adhesive strips over the wound area, do not take the strips off. They will fall off on their own in a few days. If the strips remain in place after 14 days, you may remove them.  Change any bandages (dressings) at least once a day or as directed by your health care provider. If the bandage sticks, soak it off with warm, soapy water.  Apply cream or ointment only as directed by your health care provider. If using cream or ointment, wash the area with soap and water 2 times a day to remove all the cream or ointment. Rinse off the soap and pat the area dry with a clean towel.  Keep the wound area dry and clean. If the bandage becomes wet or dirty, or if it develops a bad smell, change it as soon as possible.  Continue to protect the wound from injury.  Use sunscreen when out in the sun. New scars become sunburned easily. SEEK MEDICAL CARE IF:  You have increasing redness, swelling, or pain in the wound.  You see pus coming from the wound.  You have a fever.  You notice a bad smell coming from the wound or dressing.  Your wound breaks open (edges not staying together).   This information is not intended to replace advice given to you by your health care provider. Make sure you discuss any questions you have with your health care  provider.  Follow-up with her primary care provider. Apply warm compresses. Keep area clean dry and intact. Return to the emergency department if you experience severe redness or swelling of the affected area, fever, drainage.

## 2016-10-07 ENCOUNTER — Emergency Department (HOSPITAL_COMMUNITY)
Admission: EM | Admit: 2016-10-07 | Discharge: 2016-10-08 | Disposition: A | Discharge status: Home / Self Care | Payer: Medicaid - Out of State | Attending: Emergency Medicine | Admitting: Emergency Medicine

## 2016-10-07 ENCOUNTER — Encounter (HOSPITAL_COMMUNITY): Payer: Self-pay

## 2016-10-07 DIAGNOSIS — R42 Dizziness and giddiness: Secondary | ICD-10-CM

## 2016-10-07 DIAGNOSIS — Z87891 Personal history of nicotine dependence: Secondary | ICD-10-CM | POA: Insufficient documentation

## 2016-10-07 DIAGNOSIS — Z79899 Other long term (current) drug therapy: Secondary | ICD-10-CM | POA: Insufficient documentation

## 2016-10-07 DIAGNOSIS — J45909 Unspecified asthma, uncomplicated: Secondary | ICD-10-CM | POA: Insufficient documentation

## 2016-10-07 LAB — COMPREHENSIVE METABOLIC PANEL WITH GFR
ALT: 30 U/L (ref 14–54)
AST: 25 U/L (ref 15–41)
Albumin: 3.9 g/dL (ref 3.5–5.0)
Alkaline Phosphatase: 78 U/L (ref 38–126)
Anion gap: 7 (ref 5–15)
BUN: 15 mg/dL (ref 6–20)
CO2: 25 mmol/L (ref 22–32)
Calcium: 8.5 mg/dL — ABNORMAL LOW (ref 8.9–10.3)
Chloride: 104 mmol/L (ref 101–111)
Creatinine, Ser: 0.72 mg/dL (ref 0.44–1.00)
GFR calc Af Amer: >60 mL/min
GFR calc non Af Amer: >60 mL/min
Glucose, Bld: 114 mg/dL — ABNORMAL HIGH (ref 65–99)
Potassium: 3.8 mmol/L (ref 3.5–5.1)
Sodium: 136 mmol/L (ref 135–145)
Total Bilirubin: 0.6 mg/dL (ref 0.3–1.2)
Total Protein: 6.7 g/dL (ref 6.5–8.1)

## 2016-10-07 LAB — CBC WITH DIFFERENTIAL/PLATELET
Basophils Absolute: 0 K/uL (ref 0.0–0.1)
Basophils Relative: 0 %
Eosinophils Absolute: 0.3 K/uL (ref 0.0–0.7)
Eosinophils Relative: 2 %
HCT: 35.9 % — ABNORMAL LOW (ref 36.0–46.0)
Hemoglobin: 12.5 g/dL (ref 12.0–15.0)
Lymphocytes Relative: 28 %
Lymphs Abs: 3.6 K/uL (ref 0.7–4.0)
MCH: 30.1 pg (ref 26.0–34.0)
MCHC: 34.8 g/dL (ref 30.0–36.0)
MCV: 86.5 fL (ref 78.0–100.0)
Monocytes Absolute: 0.9 K/uL (ref 0.1–1.0)
Monocytes Relative: 7 %
Neutro Abs: 8.3 K/uL — ABNORMAL HIGH (ref 1.7–7.7)
Neutrophils Relative %: 63 %
Platelets: 234 K/uL (ref 150–400)
RBC: 4.15 MIL/uL (ref 3.87–5.11)
RDW: 12.9 % (ref 11.5–15.5)
WBC: 13.1 K/uL — ABNORMAL HIGH (ref 4.0–10.5)

## 2016-10-07 MED ORDER — MECLIZINE HCL 25 MG PO TABS
25.0000 mg | ORAL_TABLET | Freq: Once | ORAL | Status: AC
  Administered 2016-10-07: 25 mg via ORAL
  Filled 2016-10-07: qty 1

## 2016-10-07 MED ORDER — SODIUM CHLORIDE 0.9 % IV BOLUS (SEPSIS)
1000.0000 mL | Freq: Once | INTRAVENOUS | Status: AC
  Administered 2016-10-07: 1000 mL via INTRAVENOUS

## 2016-10-07 NOTE — ED Triage Notes (Addendum)
PT C/O DIZZINESS SINCE 7 PM TONIGHT. PT STS SHE WAS WATCHING TV WHEN IT STARTED. DENIES HEADACHE, N/V, BUT STS SHE HEARS HER HEART BEAT IN THE RIGHT EAR.Marland Kitchen. PT STS THIS HAS HAPPENED IN THE PAST, BUT HAS NEVER LASTED THIS LONG.

## 2016-10-08 MED ORDER — MECLIZINE HCL 25 MG PO TABS: 25.0000 mg | ORAL_TABLET | Freq: Three times a day (TID) | ORAL | 0 refills | Status: AC | PRN

## 2016-10-08 NOTE — Discharge Instructions (Signed)
Take meclizine as prescribed. Follow up with neurology if symptoms do not improve. Return to the ED if you experience loss of consciousness, chest pain, weakness, numbness or tingling in an extremity.

## 2016-10-08 NOTE — ED Provider Notes (Signed)
[facility]-EMERGENCY DEPT Provider Note   CSN: [id number] Arrival date & time: 10/07/16  2039     History   Chief Complaint Chief Complaint  Patient presents with  . Dizziness    HPI Amanda Beck is a 45 y.o. female the past medical history of bipolar, asthma who presents to the ED today complaining of dizziness. Patient states that around 5 PM this evening she was at the movies and experienced sudden onset dizziness. She describes it as a sensation of feeling like the room is spinning around her. She states the sensation is worse when she turns her head or stands up. She notes that she feels like her right ear is swollen and she can "hear her heartbeat in her right ear". She has had similar episodes like this in the past that she said typically they were self-limiting and she has never seen him write her for this before. She denies any loss of consciousness, neck pain, headache, recent URI, chest pain, weakness, paresthesias.  HPI  Past Medical History:  Diagnosis Date  . Asthma   . Back pain   . Bipolar 1 disorder (HCC)     There are no active problems to display for this patient.   Past Surgical History:  Procedure Laterality Date  . ABDOMINAL HYSTERECTOMY    . CESAREAN SECTION    . KNEE SURGERY     left    OB History    No data available       Home Medications    Prior to Admission medications   Medication Sig Start Date End Date Taking? Authorizing Provider  albuterol (PROVENTIL HFA;VENTOLIN HFA) 108 (90 BASE) MCG/ACT inhaler Inhale 2 puffs into the lungs every 6 (six) hours as needed for wheezing or shortness of breath.    Yes Historical Provider, MD  ibuprofen (ADVIL,MOTRIN) 200 MG tablet Take 600-800 mg by mouth every 6 (six) hours as needed for moderate pain.    Yes Historical Provider, MD  cephALEXin (KEFLEX) 500 MG capsule Take 1 capsule (500 mg total) by mouth 4 (four) times daily. Patient not taking: Reported on 10/07/2016 11/17/15   Dahlia ClientHannah  Muthersbaugh, PA-C  fluconazole (DIFLUCAN) 150 MG tablet Take 1 tablet (150 mg total) by mouth once. Patient not taking: Reported on 10/07/2016 11/17/15   Dahlia ClientHannah Muthersbaugh, PA-C  predniSONE (DELTASONE) 10 MG tablet Take 1 tablet (10 mg total) by mouth 2 (two) times daily with a meal. Patient not taking: Reported on 10/07/2016 12/02/15   Dub MikesSamantha Tripp Cindy Brindisi, PA-C    Family History History reviewed. No pertinent family history.  Social History Social History  Substance Use Topics  . Smoking status: Former Smoker    Packs/day: 0.25    Years: 5.00    Types: Cigarettes    Quit date: 04/06/2016  . Smokeless tobacco: Never Used  . Alcohol use Yes     Comment: occasionally     Allergies   Bee venom   Review of Systems Review of Systems  All other systems reviewed and are negative.    Physical Exam Updated Vital Signs BP 152/89   Pulse 63   Temp 97.7 F (36.5 C) (Oral)   Resp 18   Ht 5\' 9"  (1.753 m)   Wt 136.1 kg   SpO2 100%   BMI 44.30 kg/m   Physical Exam  Constitutional: She is oriented to person, place, and time. She appears well-developed and well-nourished. No distress.  HENT:  Head: Normocephalic and atraumatic.  Mouth/Throat: No oropharyngeal exudate.  Eyes: Conjunctivae and EOM are normal. Pupils are equal, round, and reactive to light. Right eye exhibits no discharge. Left eye exhibits no discharge. No scleral icterus.  Cardiovascular: Normal rate, regular rhythm, normal heart sounds and intact distal pulses.  Exam reveals no gallop and no friction rub.   No murmur heard. Pulmonary/Chest: Effort normal and breath sounds normal. No respiratory distress. She has no wheezes. She has no rales. She exhibits no tenderness.  Abdominal: Soft. She exhibits no distension. There is no tenderness. There is no guarding.  Musculoskeletal: Normal range of motion. She exhibits no edema.  Neurological: She is alert and oriented to person, place, and time.  Strength 5/5  throughout. No sensory deficits. No gait abnormality. No dysmetria. No slurred speech. No facial droop. Negative pronator drift.    Skin: Skin is warm and dry. No rash noted. She is not diaphoretic. No erythema. No pallor.  Psychiatric: She has a normal mood and affect. Her behavior is normal.  Nursing note and vitals reviewed.    ED Treatments / Results  Labs (all labs ordered are listed, but only abnormal results are displayed) Labs Reviewed  CBC WITH DIFFERENTIAL/PLATELET - Abnormal; Notable for the following:       Result Value   WBC 13.1 (*)    HCT 35.9 (*)    Neutro Abs 8.3 (*)    All other components within normal limits  COMPREHENSIVE METABOLIC PANEL - Abnormal; Notable for the following:    Glucose, Bld 114 (*)    Calcium 8.5 (*)    All other components within normal limits  URINALYSIS, ROUTINE W REFLEX MICROSCOPIC (NOT AT [facility])    EKG  EKG Interpretation  Date/Time:  Sunday October 07 2016 22:57:29 EST Ventricular Rate:  58 PR Interval:    QRS Duration: 100 QT Interval:  443 QTC Calculation: 436 R Axis:   68 Text Interpretation:  Sinus rhythm Confirmed by HORTON  MD, COURTNEY ([id number]) on 10/07/2016 11:06:13 PM       Radiology No results found.  Procedures Procedures (including critical care time)  Medications Ordered in ED Medications  sodium chloride 0.9 % bolus 1,000 mL (1,000 mLs Intravenous New Bag/Given 10/07/16 2242)  meclizine (ANTIVERT) tablet 25 mg (25 mg Oral Given 10/07/16 2304)     Initial Impression / Assessment and Plan / ED Course  I have reviewed the triage vital signs and the nursing notes.  Pertinent labs & imaging results that were available during my care of the patient were reviewed by me and considered in my medical decision making (see chart for details).  Clinical Course     45  year old female presents to the ED today complaining of acute onset dizziness, "feels that the room is spinning" approximately 3 hours ago. Patient  reports similar symptoms in the past over the last 3 years but states that usually they go away sooner than this. On presentation to ED, patient appears well and is in no apparent distress. Vitals are stable. No neurological deficits noted on exam. Labwork and EKG unremarkable. No orthostasis. Patient states dizziness is positional, increases with movement of her head. She was given meclizine in the ED with significant symptomatic improvement. Given the patient has had multiple similar episodes like this and clinical presentation, doubt posterior stroke. Will DC with meclizine and recommend follow-up with neurology for further evaluation. Return precautions outlined in patient discharge instructions.  Final Clinical Impressions(s) / ED Diagnoses   Final diagnoses:  Dizziness  Vertigo    New Prescriptions  New Prescriptions   No medications on file     Dub MikesSamantha Tripp Cornell Gaber, PA-C 10/10/16 1718    Lorre NickAnthony Allen, MD 10/11/16 2342

## 2017-06-03 ENCOUNTER — Emergency Department (HOSPITAL_COMMUNITY)
Admission: EM | Admit: 2017-06-03 | Discharge: 2017-06-03 | Disposition: A | Discharge status: Home / Self Care | Payer: Medicaid - Out of State | Attending: Emergency Medicine | Admitting: Emergency Medicine

## 2017-06-03 ENCOUNTER — Encounter (HOSPITAL_COMMUNITY): Payer: Self-pay | Admitting: Emergency Medicine

## 2017-06-03 DIAGNOSIS — J45909 Unspecified asthma, uncomplicated: Secondary | ICD-10-CM | POA: Insufficient documentation

## 2017-06-03 DIAGNOSIS — N12 Tubulo-interstitial nephritis, not specified as acute or chronic: Secondary | ICD-10-CM | POA: Insufficient documentation

## 2017-06-03 DIAGNOSIS — Z87891 Personal history of nicotine dependence: Secondary | ICD-10-CM | POA: Insufficient documentation

## 2017-06-03 LAB — URINALYSIS, ROUTINE W REFLEX MICROSCOPIC
BILIRUBIN URINE: NEGATIVE
Glucose, UA: NEGATIVE mg/dL
KETONES UR: NEGATIVE mg/dL
NITRITE: POSITIVE — AB
Protein, ur: 100 mg/dL — AB
Specific Gravity, Urine: 1.019 (ref 1.005–1.030)
pH: 5 (ref 5.0–8.0)

## 2017-06-03 MED ORDER — LEVOFLOXACIN 750 MG PO TABS: 750.0000 mg | ORAL_TABLET | Freq: Every day | ORAL | 0 refills | Status: AC

## 2017-06-03 MED ORDER — FLUCONAZOLE 150 MG PO TABS: 150.0000 mg | ORAL_TABLET | Freq: Every day | ORAL | 0 refills | Status: AC

## 2017-06-03 NOTE — ED Triage Notes (Signed)
Patient states that since Friday she had dysuria and bilat flank pain. patietn has PMH UTIs. patietn reports taking OTC Azo.

## 2017-06-03 NOTE — ED Provider Notes (Signed)
[facility]-EMER[facility] DEPT Provider Note   CSN: [id number] Arrival date & time: 06/03/17  1146     History   Chief Complaint Chief Complaint  Patient presents with  . Flank Pain  . Dysuria    HPI Amanda Beck is a 46 y.o. female.  HPI Patient with left flank pain for the last 2 days. Dysuria. States it feels like previous urinary tract infection no dominant. Slight nausea. No vomiting. Has taken Azo for it. Able to tolerate orals. Not lightheaded or dizzy. Feels a little bad all over. Pain is dull. Past Medical History:  Diagnosis Date  . Asthma   . Back pain   . Bipolar 1 disorder (HCC)     There are no active problems to display for this patient.   Past Surgical History:  Procedure Laterality Date  . ABDOMINAL HYSTERECTOMY    . CESAREAN SECTION    . KNEE SURGERY     left    OB History    No data available       Home Medications    Prior to Admission medications   Medication Sig Start Date End Date Taking? Authorizing Provider  albuterol (PROVENTIL HFA;VENTOLIN HFA) 108 (90 BASE) MCG/ACT inhaler Inhale 2 puffs into the lungs every 6 (six) hours as needed for wheezing or shortness of breath.     [provider]  cephALEXin (KEFLEX) 500 MG capsule Take 1 capsule (500 mg total) by mouth 4 (four) times daily. Patient not taking: Reported on 10/07/2016 11/17/15   Muthersbaugh, Dahlia Client, PA-C  fluconazole (DIFLUCAN) 150 MG tablet Take 1 tablet (150 mg total) by mouth daily. Take after finishing antibiotics. 06/03/17 06/10/17  Benjiman Core, MD  ibuprofen (ADVIL,MOTRIN) 200 MG tablet Take 600-800 mg by mouth every 6 (six) hours as needed for moderate pain.     [provider]  levofloxacin (LEVAQUIN) 750 MG tablet Take 1 tablet (750 mg total) by mouth daily. 06/03/17   Benjiman Core, MD  meclizine (ANTIVERT) 25 MG tablet Take 1 tablet (25 mg total) by mouth 3 (three) times daily as needed for dizziness. 10/08/16   Dowless, Lelon Mast Tripp, PA-C    predniSONE (DELTASONE) 10 MG tablet Take 1 tablet (10 mg total) by mouth 2 (two) times daily with a meal. Patient not taking: Reported on 10/07/2016 12/02/15   Dowless, Lester Kinsman, PA-C    Family History No family history on file.  Social History Social History  Substance Use Topics  . Smoking status: Former Smoker    Packs/day: 0.25    Years: 5.00    Types: Cigarettes    Quit date: 04/06/2016  . Smokeless tobacco: Never Used  . Alcohol use Yes     Comment: occasionally     Allergies   Bee venom   Review of Systems Review of Systems  Constitutional: Positive for appetite change. Negative for fatigue.  HENT: Negative for congestion.   Gastrointestinal: Positive for nausea. Negative for vomiting.  Genitourinary: Positive for dysuria, flank pain and urgency.  Musculoskeletal: Negative for back pain.  Skin: Negative for pallor.  Neurological: Negative for seizures.  Hematological: Negative for adenopathy.     Physical Exam Updated Vital Signs BP (!) 171/113 (BP Location: Left Wrist)   Pulse 66   Temp 98.2 F (36.8 C) (Oral)   Resp 17   Ht 5\' 9"  (1.753 m)   Wt 132 kg (291 lb)   SpO2 97%   BMI 42.97 kg/m   Physical Exam  Constitutional: She appears well-developed.  HENT:  Head: Normocephalic.  Neck: Neck supple.  Cardiovascular: Normal rate.   Pulmonary/Chest: Effort normal.  Abdominal: There is no tenderness.  Genitourinary:  Genitourinary Comments: Mild CVA tenderness on left.  Musculoskeletal: She exhibits no edema.  Neurological: She is alert.  Skin: Skin is warm. Capillary refill takes less than 2 seconds.  Psychiatric: She has a normal mood and affect.     ED Treatments / Results  Labs (all labs ordered are listed, but only abnormal results are displayed) Labs Reviewed  URINALYSIS, ROUTINE W REFLEX MICROSCOPIC - Abnormal; Notable for the following:       Result Value   Color, Urine AMBER (*)    APPearance CLOUDY (*)    Hgb urine dipstick  LARGE (*)    Protein, ur 100 (*)    Nitrite POSITIVE (*)    Leukocytes, UA LARGE (*)    Bacteria, UA MANY (*)    Squamous Epithelial / LPF 0-5 (*)    All other components within normal limits  URINE CULTURE    EKG  EKG Interpretation None       Radiology No results found.  Procedures Procedures (including critical care time)  Medications Ordered in ED Medications - No data to display   Initial Impression / Assessment and Plan / ED Course  I have reviewed the triage vital signs and the nursing notes.  Pertinent labs & imaging results that were available during my care of the patient were reviewed by me and considered in my medical decision making (see chart for details).     Patient with likely mild pyelonephritis. Left flank pain. Well-appearing here. Culture sent. Will treat with 7 days of Levaquin. Also given Diflucan to take after the antibiotics since she has history of yeast infections.  Final Clinical Impressions(s) / ED Diagnoses   Final diagnoses:  Pyelonephritis    New Prescriptions Discharge Medication List as of 06/03/2017  1:37 PM    START taking these medications   Details  levofloxacin (LEVAQUIN) 750 MG tablet Take 1 tablet (750 mg total) by mouth daily., Starting Mon 06/03/2017, Print         Benjiman CorePickering, Cayde Held, MD 06/03/17 [phone]

## 2017-06-03 NOTE — ED Notes (Signed)
ED Provider at bedside. 

## 2017-06-05 LAB — URINE CULTURE: Culture: >=100000 — AB

## 2017-06-06 ENCOUNTER — Telehealth: Payer: Self-pay | Admitting: Emergency Medicine

## 2017-06-06 NOTE — Telephone Encounter (Signed)
Post ED Visit - Positive Culture Follow-up  Culture report reviewed by antimicrobial stewardship pharmacist:  []  Enzo BiNathan Batchelder, Pharm.D. []  Celedonio MiyamotoJeremy Frens, Pharm.D., BCPS AQ-ID []  Garvin FilaMike Maccia, Pharm.D., BCPS [x]  Georgina PillionElizabeth Martin, Pharm.D., BCPS []  HotchkissMinh Pham, VermontPharm.D., BCPS, AAHIVP []  Estella HuskMichelle Turner, Pharm.D., BCPS, AAHIVP []  Lysle Pearlachel Rumbarger, PharmD, BCPS []  Casilda Carlsaylor Stone, PharmD, BCPS []  Pollyann SamplesAndy Johnston, PharmD, BCPS  Positive urine culture Treated with levofloxacin and fluconazole, organism sensitive to the same and no further patient follow-up is required at this time.  Berle MullMiller, Kyen Taite 06/06/2017, 10:30 AM

## 2018-02-18 ENCOUNTER — Emergency Department (HOSPITAL_COMMUNITY)
Admission: EM | Admit: 2018-02-18 | Discharge: 2018-02-18 | Disposition: A | Discharge status: Home / Self Care | Payer: Self-pay | Attending: Emergency Medicine | Admitting: Emergency Medicine

## 2018-02-18 ENCOUNTER — Emergency Department (HOSPITAL_COMMUNITY): Payer: Self-pay

## 2018-02-18 ENCOUNTER — Other Ambulatory Visit: Payer: Self-pay

## 2018-02-18 ENCOUNTER — Encounter (HOSPITAL_COMMUNITY): Payer: Self-pay | Admitting: Nurse Practitioner

## 2018-02-18 DIAGNOSIS — Z79899 Other long term (current) drug therapy: Secondary | ICD-10-CM | POA: Insufficient documentation

## 2018-02-18 DIAGNOSIS — F17210 Nicotine dependence, cigarettes, uncomplicated: Secondary | ICD-10-CM | POA: Insufficient documentation

## 2018-02-18 DIAGNOSIS — J45909 Unspecified asthma, uncomplicated: Secondary | ICD-10-CM | POA: Insufficient documentation

## 2018-02-18 DIAGNOSIS — I10 Essential (primary) hypertension: Secondary | ICD-10-CM | POA: Insufficient documentation

## 2018-02-18 DIAGNOSIS — R002 Palpitations: Secondary | ICD-10-CM | POA: Insufficient documentation

## 2018-02-18 DIAGNOSIS — R51 Headache: Secondary | ICD-10-CM | POA: Insufficient documentation

## 2018-02-18 DIAGNOSIS — R519 Headache, unspecified: Secondary | ICD-10-CM

## 2018-02-18 LAB — BASIC METABOLIC PANEL
Anion gap: 9 (ref 5–15)
BUN: 19 mg/dL (ref 6–20)
CHLORIDE: 102 mmol/L (ref 101–111)
CO2: 27 mmol/L (ref 22–32)
CREATININE: 0.85 mg/dL (ref 0.44–1.00)
Calcium: 8.9 mg/dL (ref 8.9–10.3)
GFR calc Af Amer: >60 mL/min (ref >60)
GFR calc non Af Amer: >60 mL/min (ref >60)
GLUCOSE: 97 mg/dL (ref 65–99)
Potassium: 4 mmol/L (ref 3.5–5.1)
SODIUM: 138 mmol/L (ref 135–145)

## 2018-02-18 LAB — CBC
HCT: 41.8 % (ref 36.0–46.0)
Hemoglobin: 14.1 g/dL (ref 12.0–15.0)
MCH: 30.1 pg (ref 26.0–34.0)
MCHC: 33.7 g/dL (ref 30.0–36.0)
MCV: 89.3 fL (ref 78.0–100.0)
PLATELETS: 262 10*3/uL (ref 150–400)
RBC: 4.68 MIL/uL (ref 3.87–5.11)
RDW: 12.9 % (ref 11.5–15.5)
WBC: 11.7 10*3/uL — ABNORMAL HIGH (ref 4.0–10.5)

## 2018-02-18 LAB — I-STAT TROPONIN, ED: Troponin i, poc: 0 ng/mL (ref 0.00–0.08)

## 2018-02-18 MED ORDER — LISINOPRIL 10 MG PO TABS: 10.0000 mg | ORAL_TABLET | Freq: Every day | ORAL | 0 refills | Status: AC

## 2018-02-18 MED ORDER — ACETAMINOPHEN 325 MG PO TABS
650.0000 mg | ORAL_TABLET | Freq: Once | ORAL | Status: AC
  Administered 2018-02-18: 650 mg via ORAL
  Filled 2018-02-18: qty 2

## 2018-02-18 MED ORDER — METOCLOPRAMIDE HCL 5 MG/ML IJ SOLN
10.0000 mg | Freq: Once | INTRAMUSCULAR | Status: AC
  Administered 2018-02-18: 10 mg via INTRAVENOUS
  Filled 2018-02-18: qty 2

## 2018-02-18 MED ORDER — LISINOPRIL 10 MG PO TABS
10.0000 mg | ORAL_TABLET | Freq: Once | ORAL | Status: AC
  Administered 2018-02-18: 10 mg via ORAL
  Filled 2018-02-18: qty 1

## 2018-02-18 MED ORDER — IOPAMIDOL (ISOVUE-370) INJECTION 76%
INTRAVENOUS | Status: AC
  Administered 2018-02-18: 100 mL
  Filled 2018-02-18: qty 100

## 2018-02-18 MED ORDER — METHYLPREDNISOLONE SODIUM SUCC 125 MG IJ SOLR
125.0000 mg | Freq: Once | INTRAMUSCULAR | Status: AC
  Administered 2018-02-18: 125 mg via INTRAVENOUS
  Filled 2018-02-18: qty 2

## 2018-02-18 NOTE — ED Triage Notes (Signed)
Patient came to ER today for headache and chest discomfort for about 2 weeks now. Chest discomfort has felt more like palpitations she had her BP checked and it was always 160's over 100's. Patient has a history of GERD and indigestion that she struggles with. The discomfort is on the left side of her chest. The headache has been ongoing for a month now. It hurts in the back of her head and it pulsates. She states this happens when her BP is high. Patient used to be on Lisinopril however was taken off when she lost a bunch of weight. Patient feels a tiny bit on SOB with exertion only.

## 2018-02-18 NOTE — ED Provider Notes (Signed)
[facility]-EMERGENCY DEPT Provider Note   CSN: [id number] Arrival date & time: 02/18/18  0941     History   Chief Complaint No chief complaint on file.   HPI Amanda Beck is a 47 y.o. female.  HPI   Amanda Beck is a 47 y.o. female, with a history of HTN, asthma, and bipolar, presenting to the ED with headache for the past two weeks.  Pain is in the back of the head, throbbing, 5/10, nonradiating. States she has had similar headaches in the past that she was told were migraines. However, she has not had a headache that has lasted this long.  Headaches worsen with sitting up.  Has been taking ibuprofen for her pain. She notes she has been taking her blood pressure lately and it has been in the range of 160-200/100s.  She also endorses intermittent palpitations.   Denies fever/chills, nausea/vomiting, vision abnormalities, neuro deficits, falls/trauma, dizziness, shortness of breath, or any other complaints.    Past Medical History:  Diagnosis Date  . Asthma   . Back pain   . Bipolar 1 disorder (HCC)     There are no active problems to display for this patient.   Past Surgical History:  Procedure Laterality Date  . ABDOMINAL HYSTERECTOMY    . CESAREAN SECTION    . KNEE SURGERY     left     OB History   None      Home Medications    Prior to Admission medications   Medication Sig Start Date End Date Taking? Authorizing Provider  ibuprofen (ADVIL,MOTRIN) 200 MG tablet Take 600-800 mg by mouth every 6 (six) hours as needed for moderate pain.    Yes [provider]  meclizine (ANTIVERT) 25 MG tablet Take 1 tablet (25 mg total) by mouth 3 (three) times daily as needed for dizziness. 10/08/16  Yes Dowless, Lester Kinsman, PA-C  Multiple Vitamin (MULTIVITAMIN WITH MINERALS) TABS tablet Take 1 tablet by mouth daily.   Yes [provider]  albuterol (PROVENTIL HFA;VENTOLIN HFA) 108 (90 BASE) MCG/ACT inhaler Inhale 2  puffs into the lungs every 6 (six) hours as needed for wheezing or shortness of breath.     [provider]  cephALEXin (KEFLEX) 500 MG capsule Take 1 capsule (500 mg total) by mouth 4 (four) times daily. Patient not taking: Reported on 02/18/2018 11/17/15   Muthersbaugh, Dahlia Client, PA-C  levofloxacin (LEVAQUIN) 750 MG tablet Take 1 tablet (750 mg total) by mouth daily. Patient not taking: Reported on 02/18/2018 06/03/17   Benjiman Core, MD  lisinopril (PRINIVIL,ZESTRIL) 10 MG tablet Take 1 tablet (10 mg total) by mouth daily. 02/18/18   Deondrae Mcgrail C, PA-C  predniSONE (DELTASONE) 10 MG tablet Take 1 tablet (10 mg total) by mouth 2 (two) times daily with a meal. Patient not taking: Reported on 02/18/2018 12/02/15   Dowless, Lester Kinsman, PA-C    Family History Family History  Problem Relation Age of Onset  . Aneurysm Paternal Aunt 41       Brain    Social History Social History   Tobacco Use  . Smoking status: Former Smoker    Packs/day: 0.25    Years: 5.00    Pack years: 1.25    Types: Cigarettes    Last attempt to quit: 04/06/2016    Years since quitting: 1.8  . Smokeless tobacco: Never Used  . Tobacco comment: patient stopped smoking 2 years ago  Substance Use Topics  . Alcohol use: Yes  Comment: occasionally  . Drug use: No     Allergies   Bee venom and Levaquin [levofloxacin]   Review of Systems Review of Systems  Constitutional: Negative for chills, diaphoresis and fever.  Eyes: Negative for visual disturbance.  Respiratory: Negative for cough and shortness of breath.   Cardiovascular: Positive for palpitations. Negative for chest pain.  Gastrointestinal: Negative for abdominal pain, diarrhea, nausea and vomiting.  Neurological: Positive for headaches. Negative for dizziness, syncope, weakness, light-headedness and numbness.  All other systems reviewed and are negative.    Physical Exam Updated Vital Signs BP (!) 179/103   Pulse 62   Temp 97.9 F  (36.6 C)   Resp 15   Ht 5\' 9"  (1.753 m)   Wt (!) 139.3 kg (307 lb)   SpO2 96%   BMI 45.34 kg/m   Physical Exam  Constitutional: She appears well-developed and well-nourished. No distress.  HENT:  Head: Normocephalic and atraumatic.  Eyes: Pupils are equal, round, and reactive to light. Conjunctivae and EOM are normal.  Neck: Normal range of motion. Neck supple.  Cardiovascular: Normal rate, regular rhythm, normal heart sounds and intact distal pulses.  Pulmonary/Chest: Effort normal and breath sounds normal. No respiratory distress.  Abdominal: Soft. There is no tenderness. There is no guarding.  Musculoskeletal: She exhibits no edema.  Normal motor function intact in all extremities and spine. No midline spinal tenderness.   Lymphadenopathy:    She has no cervical adenopathy.  Neurological: She is alert.  No sensory deficits.  No noted speech deficits. No aphasia. Patient handles oral secretions without difficulty. No noted swallowing defects.  Equal grip strength bilaterally. Strength 5/5 in the upper extremities. Strength 5/5 with flexion and extension of the hips, knees, and ankles bilaterally.  Negative Romberg. No gait disturbance.  Coordination intact including heel to shin and finger to nose.  Cranial nerves III-XII grossly intact.  No facial droop.   Skin: Skin is warm and dry. She is not diaphoretic.  Psychiatric: She has a normal mood and affect. Her behavior is normal.  Nursing note and vitals reviewed.    ED Treatments / Results  Labs (all labs ordered are listed, but only abnormal results are displayed) Labs Reviewed  CBC - Abnormal; Notable for the following components:      Result Value   WBC 11.7 (*)    All other components within normal limits  BASIC METABOLIC PANEL  I-STAT TROPONIN, ED    EKG EKG Interpretation  Date/Time:  Tuesday February 18 2018 09:52:56 EDT Ventricular Rate:  67 PR Interval:    QRS Duration: 111 QT Interval:  403 QTC  Calculation: 426 R Axis:   52 Text Interpretation:  Sinus rhythm since last tracing no significant change Confirmed by Mancel Bale [phone]) on 02/18/2018 7:32:03 PM   Radiology Ct Angio Head W Or Wo Contrast  Result Date: 02/18/2018 CLINICAL DATA:  Headache EXAM: CT ANGIOGRAPHY HEAD AND NECK TECHNIQUE: Multidetector CT imaging of the head and neck was performed using the standard protocol during bolus administration of intravenous contrast. Multiplanar CT image reconstructions and MIPs were obtained to evaluate the vascular anatomy. Carotid stenosis measurements (when applicable) are obtained utilizing NASCET criteria, using the distal internal carotid diameter as the denominator. CONTRAST:  ISOVUE-370 IOPAMIDOL (ISOVUE-370) INJECTION 76% COMPARISON:  CT head 03/14/2007 FINDINGS: CT HEAD FINDINGS Brain: No evidence of acute infarction, hemorrhage, hydrocephalus, extra-axial collection or mass lesion/mass effect. Vascular: No hyperdense vessel or unexpected calcification. Skull: Negative Sinuses: Negative Orbits: None  Review of the MIP images confirms the above findings CTA NECK FINDINGS Aortic arch: Normal arch and proximal great vessels Right carotid system: Normal right carotid system without stenosis. No atherosclerotic disease or dissection Left carotid system: Normal left carotid system. Negative for atherosclerotic disease or dissection. Vertebral arteries: Normal Skeleton: Negative Other neck: Negative Upper chest: Negative Review of the MIP images confirms the above findings CTA HEAD FINDINGS Anterior circulation: Cavernous carotid normal bilaterally. Anterior and middle cerebral arteries normal bilaterally. Negative for stenosis or occlusion. Negative for vascular malformation Posterior circulation: Both vertebral arteries are widely patent to the basilar. PICA patent bilaterally. Basilar widely patent. Superior cerebellar and posterior cerebral arteries patent bilaterally. Fetal origin right  posterior cerebral artery. Negative for vascular malformation. Venous sinuses: Negative Anatomic variants: None Delayed phase: Normal enhancement following contrast infusion. Review of the MIP images confirms the above findings IMPRESSION: Negative CTA head and neck Electronically Signed   By: Marlan Palauharles  Clark M.D.   On: 02/18/2018 18:54   Dg Chest 2 View  Result Date: 02/18/2018 CLINICAL DATA:  Left chest pain for 2 weeks.  Palpitations. EXAM: CHEST - 2 VIEW COMPARISON:  Chest CT 05/12/2014 FINDINGS: The lungs appear clear.  Cardiac and mediastinal contours normal. No pleural effusion identified. Mild thoracic spondylosis. IMPRESSION: 1.  No active cardiopulmonary disease is radiographically apparent. 2. Mild thoracic spondylosis. Electronically Signed   By: Gaylyn RongWalter  Liebkemann M.D.   On: 02/18/2018 10:56   Ct Angio Neck W And/or Wo Contrast  Result Date: 02/18/2018 CLINICAL DATA:  Headache EXAM: CT ANGIOGRAPHY HEAD AND NECK TECHNIQUE: Multidetector CT imaging of the head and neck was performed using the standard protocol during bolus administration of intravenous contrast. Multiplanar CT image reconstructions and MIPs were obtained to evaluate the vascular anatomy. Carotid stenosis measurements (when applicable) are obtained utilizing NASCET criteria, using the distal internal carotid diameter as the denominator. CONTRAST:  100mL ISOVUE-370 IOPAMIDOL (ISOVUE-370) INJECTION 76% COMPARISON:  CT head 03/14/2007 FINDINGS: CT HEAD FINDINGS Brain: No evidence of acute infarction, hemorrhage, hydrocephalus, extra-axial collection or mass lesion/mass effect. Vascular: No hyperdense vessel or unexpected calcification. Skull: Negative Sinuses: Negative Orbits: None Review of the MIP images confirms the above findings CTA NECK FINDINGS Aortic arch: Normal arch and proximal great vessels Right carotid system: Normal right carotid system without stenosis. No atherosclerotic disease or dissection Left carotid system:  Normal left carotid system. Negative for atherosclerotic disease or dissection. Vertebral arteries: Normal Skeleton: Negative Other neck: Negative Upper chest: Negative Review of the MIP images confirms the above findings CTA HEAD FINDINGS Anterior circulation: Cavernous carotid normal bilaterally. Anterior and middle cerebral arteries normal bilaterally. Negative for stenosis or occlusion. Negative for vascular malformation Posterior circulation: Both vertebral arteries are widely patent to the basilar. PICA patent bilaterally. Basilar widely patent. Superior cerebellar and posterior cerebral arteries patent bilaterally. Fetal origin right posterior cerebral artery. Negative for vascular malformation. Venous sinuses: Negative Anatomic variants: None Delayed phase: Normal enhancement following contrast infusion. Review of the MIP images confirms the above findings IMPRESSION: Negative CTA head and neck Electronically Signed   By: Marlan Palauharles  Clark M.D.   On: 02/18/2018 18:54    Procedures Procedures (including critical care time)  Medications Ordered in ED Medications  lisinopril (PRINIVIL,ZESTRIL) tablet 10 mg (has no administration in time range)  metoCLOPramide (REGLAN) injection 10 mg (10 mg Intravenous Given 02/18/18 1644)  methylPREDNISolone sodium succinate (SOLU-MEDROL) 125 mg/2 mL injection 125 mg (125 mg Intravenous Given 02/18/18 1644)  acetaminophen (TYLENOL) tablet 650 mg (650  mg Oral Given 02/18/18 1644)  iopamidol (ISOVUE-370) 76 % injection (100 mLs  Contrast Given 02/18/18 1811)     Initial Impression / Assessment and Plan / ED Course  I have reviewed the triage vital signs and the nursing notes.  Pertinent labs & imaging results that were available during my care of the patient were reviewed by me and considered in my medical decision making (see chart for details).  Clinical Course as of Feb 19 1931  Tue Feb 18, 2018  1733 Patient reevaluated. States her pain has improved to 2/10.  Denies additional complaints.    [SJ]  1916 Discussed imaging results with the patient.  Her pain resolved and has not returned.   [SJ]    Clinical Course User Index [SJ] Silviano Neuser C, PA-C    Patient presents with headache and palpitations.  Persistent hypertension. No evidence of endorgan damage. PERC negative. Low suspicion for ACS. HEART score is 1, indicating low risk for a cardiac event.  Shared decision-making was used regarding imaging study options. CTA was included due to the persistence of the patient's headache, her hypertension, and her close family history of fatal aneurysm. Patient opted for CTA evaluation. Patient's headaches and palpitations may be related to her hypertension.  Since she is symptomatic and has previously been on lisinopril, I have reordered this medication for her. PCP follow up. The patient was given instructions for home care as well as return precautions. Patient voices understanding of these instructions, accepts the plan, and is comfortable with discharge.  Vitals:   02/18/18 1730 02/18/18 1733 02/18/18 1800 02/18/18 1852  BP: (!) 187/113 (!) 187/113 (!) 186/114 (!) 186/114  Pulse: (!) 59 (!) 59 70 70  Resp:  17  17  Temp:      SpO2: 99% 99% 98% 98%  Weight:      Height:         Final Clinical Impressions(s) / ED Diagnoses   Final diagnoses:  Bad headache  Essential hypertension  Palpitations    ED Discharge Orders        Ordered    lisinopril (PRINIVIL,ZESTRIL) 10 MG tablet  Daily     02/18/18 1930       Concepcion Living 02/18/18 1940    Mancel Bale, MD 02/19/18 [phone]

## 2018-02-18 NOTE — Discharge Instructions (Addendum)
Your blood pressure was elevated today. Since you were previously on lisinopril, we will start this again today. Please take this medication, as directed.  Follow-up with the primary care provider as soon as possible on this matter.  Please also follow up with the cardiologist regarding the palpitations.   Headache Your lab results were encouraging.  There were no acute abnormalities on CT scan.  For future headaches please try the following regimen: Antiinflammatory medications: Take 600 mg of ibuprofen every 6 hours or 440 mg (over the counter dose) to 500 mg (prescription dose) of naproxen every 12 hours for the next 3 days. After this time, these medications may be used as needed for pain. Take these medications with food to avoid upset stomach. Choose only one of these medications, do not take them together. Tylenol: Should you continue to have additional pain while taking the ibuprofen or naproxen, you may add in tylenol as needed. Your daily total maximum amount of tylenol from all sources should be limited to 4000mg /day for persons without liver problems, or 2000mg /day for those with liver problems.  Hydration: Have a goal of about a half liter of water every couple hours to stay well hydrated.   Sleep: Please be sure to get plenty of sleep with a goal of 8 hours per night. Having a regular bed time and bedtime routine can help with this.  Screens: Reduce the amount of time you are in front of screens.  Take about a 5-10-minute break every hour or every couple hours to give your eyes rest.  Do not use screens in dark rooms.  Glasses with a blue light filter may also help reduce eye fatigue.  Stress: Take steps to reduce stress as much as possible.   Follow up: Follow-up with your primary care provider on this issue.

## 2019-07-30 IMAGING — CT CT ANGIO NECK
2 of 12 series · 6 of 33 positions shown · IV contrast (ISOVUE)
Comparison: CT head 03/14/2007

CLINICAL DATA: Headache

EXAM:
CT ANGIOGRAPHY HEAD AND NECK
TECHNIQUE: Multidetector CT imaging of the head and neck was performed using
the standard protocol during bolus administration of intravenous
contrast. Multiplanar CT image reconstructions and MIPs were
obtained to evaluate the vascular anatomy. Carotid stenosis
measurements (when applicable) are obtained utilizing NASCET
criteria, using the distal internal carotid diameter as the
denominator.
CONTRAST:  100mL LZVOZR-9OV IOPAMIDOL (LZVOZR-9OV) INJECTION 76%

[Series 11: cta head neck thins · axial · 0.35mm/px · z∈[+1235,+1434]mm · 4 of 666 slices shown]
[im 134/666  soft-tissue]
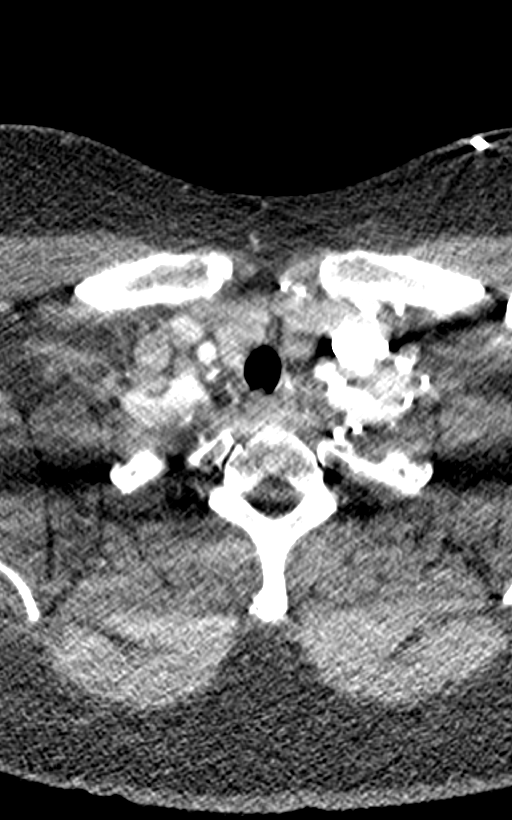
[im 267/666  bone]
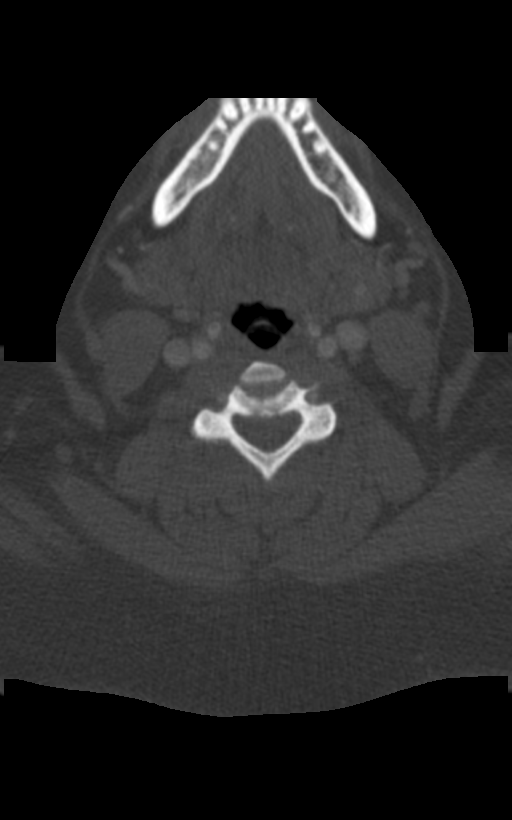
[im 400/666  soft-tissue]
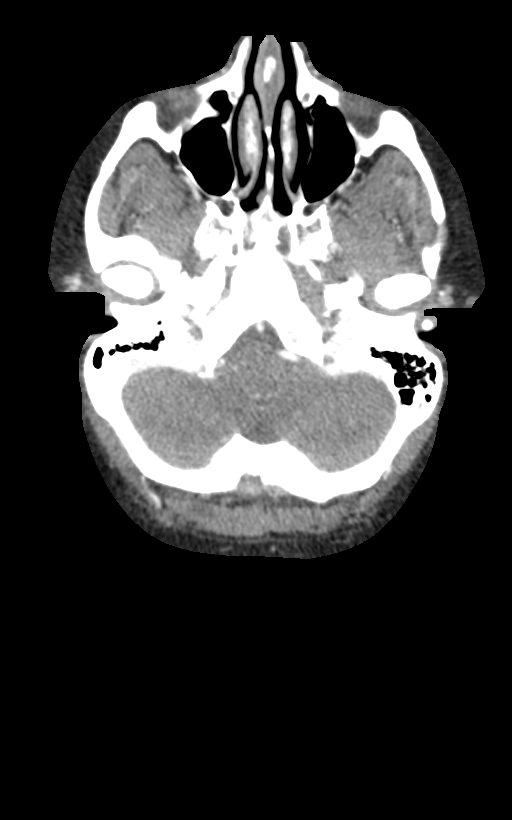
[im 533/666  bone]
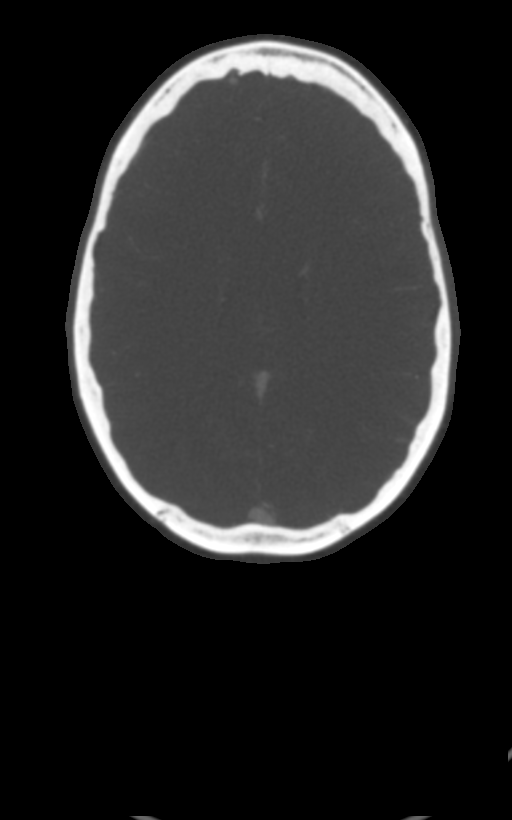

[Series 12: ax thin · axial · 0.35mm/px · z∈[+1279,+1390]mm · 2 of 333 slices shown]
[im 111/333  soft-tissue]
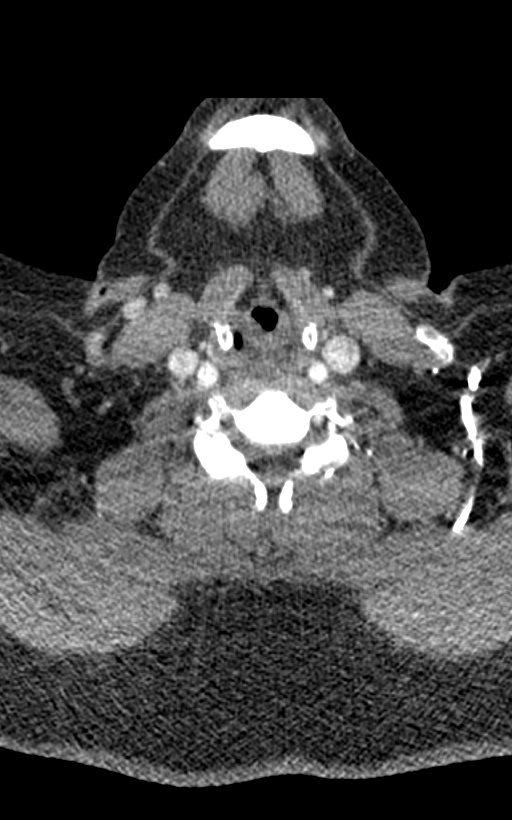
[im 222/333  soft-tissue]
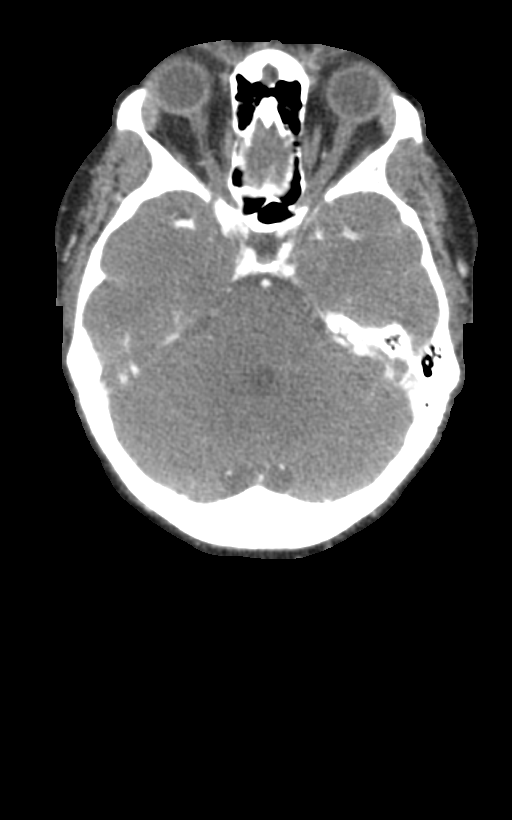

[6 of 33 positions shown; findings below may reference images not displayed]

FINDINGS: CT HEAD FINDINGS

Brain: No evidence of acute infarction, hemorrhage, hydrocephalus,
extra-axial collection or mass lesion/mass effect.

Vascular: No hyperdense vessel or unexpected calcification.

Skull: Negative

Sinuses: Negative

Orbits: None

Review of the MIP images confirms the above findings

CTA NECK FINDINGS

Aortic arch: Normal arch and proximal great vessels

Right carotid system: Normal right carotid system without stenosis.
No atherosclerotic disease or dissection

Left carotid system: Normal left carotid system. Negative for
atherosclerotic disease or dissection.

Vertebral arteries: Normal

Skeleton: Negative

Other neck: Negative

Upper chest: Negative

Review of the MIP images confirms the above findings

CTA HEAD FINDINGS

Anterior circulation: Cavernous carotid normal bilaterally. Anterior
and middle cerebral arteries normal bilaterally. Negative for
stenosis or occlusion. Negative for vascular malformation

Posterior circulation: Both vertebral arteries are widely patent to
the basilar. PICA patent bilaterally. Basilar widely patent.
Superior cerebellar and posterior cerebral arteries patent
bilaterally. Fetal origin right posterior cerebral artery. Negative
for vascular malformation.

Venous sinuses: Negative

Anatomic variants: None

Delayed phase: Normal enhancement following contrast infusion.

Review of the MIP images confirms the above findings
IMPRESSION: Negative CTA head and neck
